# Patient Record
Sex: Female | Born: 1995 | Race: Black or African American | Hispanic: No | Marital: Single | Smoking: Never smoker
Health system: Southern US, Community
[De-identification: ages and names within clinical notes are randomized; demographics above are authoritative.]

## PROBLEM LIST (undated history)

## (undated) DIAGNOSIS — B009 Herpesviral infection, unspecified: Secondary | ICD-10-CM

## (undated) DIAGNOSIS — F329 Major depressive disorder, single episode, unspecified: Secondary | ICD-10-CM

## (undated) DIAGNOSIS — F419 Anxiety disorder, unspecified: Secondary | ICD-10-CM

## (undated) DIAGNOSIS — F32A Depression, unspecified: Secondary | ICD-10-CM

## (undated) DIAGNOSIS — K219 Gastro-esophageal reflux disease without esophagitis: Secondary | ICD-10-CM

## (undated) DIAGNOSIS — J45909 Unspecified asthma, uncomplicated: Secondary | ICD-10-CM

## (undated) DIAGNOSIS — G43909 Migraine, unspecified, not intractable, without status migrainosus: Secondary | ICD-10-CM

## (undated) DIAGNOSIS — U071 COVID-19: Secondary | ICD-10-CM

## (undated) DIAGNOSIS — R011 Cardiac murmur, unspecified: Secondary | ICD-10-CM

## (undated) HISTORY — PX: WISDOM TOOTH EXTRACTION: SHX21

## (undated) HISTORY — DX: Herpesviral infection, unspecified: B00.9

## (undated) HISTORY — DX: COVID-19: U07.1

## (undated) HISTORY — DX: Major depressive disorder, single episode, unspecified: F32.9

## (undated) HISTORY — DX: Migraine, unspecified, not intractable, without status migrainosus: G43.909

## (undated) HISTORY — PX: TONSILLECTOMY: SUR1361

## (undated) HISTORY — PX: LEG SURGERY: SHX1003

## (undated) HISTORY — DX: Anxiety disorder, unspecified: F41.9

## (undated) HISTORY — DX: Depression, unspecified: F32.A

---
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
## 2016-04-06 ENCOUNTER — Encounter: Payer: Self-pay | Admitting: *Deleted

## 2016-04-06 NOTE — Discharge Instructions (Signed)
T & A INSTRUCTION SHEET - [facility] °[facility] ° °CREIGHTON VAUGHT, MD °PAUL H. JUENGEL, MD  °P. SCOTT BENNETT °CHAPMAN MCQUEEN, MD ° °[address] [place], [place] [address] TEL. [phone] °[address] [place] [address] [phone] ° °INFORMATION SHEET FOR A TONSILLECTOMY AND ADENDOIDECTOMY ° °About Your Tonsils and Adenoids ° The tonsils and adenoids are normal body tissues that are part of our immune system.  They normally help to protect us against diseases that may enter our mouth and nose.  However, sometimes the tonsils and/or adenoids become too large and obstruct our breathing, especially at night. °  ° If either of these things happen it helps to remove the tonsils and adenoids in order to become healthier. The operation to remove the tonsils and adenoids is called a tonsillectomy and adenoidectomy. ° °The Location of Your Tonsils and Adenoids ° The tonsils are located in the back of the throat on both side and sit in a cradle of muscles. The adenoids are located in the roof of the mouth, behind the nose, and closely associated with the opening of the Eustachian tube to the ear. ° °Surgery on Tonsils and Adenoids ° A tonsillectomy and adenoidectomy is a short operation which takes about thirty minutes.  This includes being put to sleep and being awakened.  Tonsillectomies and adenoidectomies are performed at [facility] and may require observation period in the recovery room prior to going home. ° °Following the Operation for a Tonsillectomy ° A cautery machine is used to control bleeding.  Bleeding from a tonsillectomy and adenoidectomy is minimal and postoperatively the risk of bleeding is approximately four percent, although this rarely life threatening. ° ° ° °After your tonsillectomy and adenoidectomy post-op care at home: ° °1. Our patients are able to go home the same day.  You may be given prescriptions for pain  medications and antibiotics, if indicated. °2. It is extremely important to remember that fluid intake is of utmost importance after a tonsillectomy.  The amount that you drink must be maintained in the postoperative period.  A good indication of whether a child is getting enough fluid is whether his/her urine output is constant.  As long as children are urinating or wetting their diaper every 6 - 8 hours this is usually enough fluid intake.   °3. Although rare, this is a risk of some bleeding in the first ten days after surgery.  This is usually occurs between day five and nine postoperatively.  This risk of bleeding is approximately four percent.  If you or your child should have any bleeding you should remain calm and notify our office or go directly to the Emergency Room at  [facility] where they will contact us. Our doctors are available seven days a week for notification.  We recommend sitting up quietly in a chair, place an ice pack on the front of the neck and spitting out the blood gently until we are able to contact you.  Adults should gargle gently with ice water and this may help stop the bleeding.  If the bleeding does not stop after a short time, i.e. 10 to 15 minutes, or seems to be increasing again, please contact us or go to the hospital.   °4. It is common for the pain to be worse at 5 - 7 days postoperatively.  This occurs because the “scab” is peeling off and the mucous membrane (skin of   the throat) is growing back where the tonsils were.   °5. It is common for a low-grade fever, less than 102, during the first week after a tonsillectomy and adenoidectomy.  It is usually due to not drinking enough liquids, and we suggest your use liquid Tylenol or the pain medicine with Tylenol prescribed in order to keep your temperature below 102.  Please follow the directions on the back of the bottle. °6. Do not take aspirin or any products that contain aspirin such as Bufferin, Anacin,  Ecotrin, aspirin gum, Goodies, BC headache powders, etc., after a T&A because it can promote bleeding.  Please check with our office before administering any other medication that may been prescribed by other doctors during the two week post-operative period. °7. If you happen to look in the mirror or into your child’s mouth you will see white/gray patches on the back of the throat.  This is what a scab looks like in the mouth and is normal after having a T&A.  It will disappear once the tonsil area heals completely. However, it may cause a noticeable odor, and this too will disappear with time.     °8. You or your child may experience ear pain after having a T&A.  This is called referred pain and comes from the throat, but it is felt in the ears.  Ear pain is quite common and expected.  It will usually go away after ten days.  There is usually nothing wrong with the ears, and it is primarily due to the healing area stimulating the nerve to the ear that runs along the side of the throat.  Use either the prescribed pain medicine or Tylenol as needed.  °9. The throat tissues after a tonsillectomy are obviously sensitive.  Smoking around children who have had a tonsillectomy significantly increases the risk of bleeding.  DO NOT SMOKE!  ° °General Anesthesia, Adult, Care After °Refer to this sheet in the next few weeks. These instructions provide you with information on caring for yourself after your procedure. Your health care provider may also give you more specific instructions. Your treatment has been planned according to current medical practices, but problems sometimes occur. Call your health care provider if you have any problems or questions after your procedure. °WHAT TO EXPECT AFTER THE PROCEDURE °After the procedure, it is typical to experience: °· Sleepiness. °· Nausea and vomiting. °HOME CARE INSTRUCTIONS °· For the first 24 hours after general anesthesia: °¨ Have a responsible person with you. °¨ Do not  drive a car. If you are alone, do not take public transportation. °¨ Do not drink alcohol. °¨ Do not take medicine that has not been prescribed by your health care provider. °¨ Do not sign important papers or make important decisions. °¨ You may resume a normal diet and activities as directed by your health care provider. °· Change bandages (dressings) as directed. °· If you have questions or problems that seem related to general anesthesia, call the hospital and ask for the anesthetist or anesthesiologist on call. °SEEK MEDICAL CARE IF: °· You have nausea and vomiting that continue the day after anesthesia. °· You develop a rash. °SEEK IMMEDIATE MEDICAL CARE IF:  °· You have difficulty breathing. °· You have chest pain. °· You have any allergic problems. °  °This information is not intended to replace advice given to you by your health care provider. Make sure you discuss any questions you have with your health care provider. °  °Document   Released: 12/21/2000 Document Revised: 10/05/2014 Document Reviewed: 01/13/2012 °Elsevier Interactive Patient Education ©2016 [employer] ° °

## 2016-04-07 ENCOUNTER — Ambulatory Visit: Payer: Medicaid Other | Admitting: Anesthesiology

## 2016-04-07 ENCOUNTER — Encounter
Admission: RE | Disposition: A | Discharge status: Home / Self Care | Payer: Self-pay | Source: Ambulatory Visit | Attending: Otolaryngology

## 2016-04-07 ENCOUNTER — Ambulatory Visit
Admission: RE | Admit: 2016-04-07 | Discharge: 2016-04-07 | Disposition: A | Discharge status: Home / Self Care | Payer: Medicaid Other | Source: Ambulatory Visit | Attending: Otolaryngology | Admitting: Otolaryngology

## 2016-04-07 DIAGNOSIS — J3501 Chronic tonsillitis: Secondary | ICD-10-CM | POA: Insufficient documentation

## 2016-04-07 DIAGNOSIS — Z79899 Other long term (current) drug therapy: Secondary | ICD-10-CM | POA: Insufficient documentation

## 2016-04-07 DIAGNOSIS — J45909 Unspecified asthma, uncomplicated: Secondary | ICD-10-CM | POA: Insufficient documentation

## 2016-04-07 DIAGNOSIS — K219 Gastro-esophageal reflux disease without esophagitis: Secondary | ICD-10-CM | POA: Insufficient documentation

## 2016-04-07 DIAGNOSIS — Z8249 Family history of ischemic heart disease and other diseases of the circulatory system: Secondary | ICD-10-CM | POA: Diagnosis not present

## 2016-04-07 DIAGNOSIS — Z825 Family history of asthma and other chronic lower respiratory diseases: Secondary | ICD-10-CM | POA: Insufficient documentation

## 2016-04-07 HISTORY — PX: TONSILLECTOMY: SHX5217

## 2016-04-07 HISTORY — DX: Cardiac murmur, unspecified: R01.1

## 2016-04-07 HISTORY — DX: Gastro-esophageal reflux disease without esophagitis: K21.9

## 2016-04-07 HISTORY — DX: Unspecified asthma, uncomplicated: J45.909

## 2016-04-07 SURGERY — TONSILLECTOMY
Anesthesia: General | Site: Throat | Laterality: Bilateral | Wound class: Clean Contaminated

## 2016-04-07 MED ORDER — MEPERIDINE HCL 25 MG/ML IJ SOLN: 6.2500 mg | INTRAMUSCULAR | Status: DC | PRN

## 2016-04-07 MED ORDER — DEXAMETHASONE SODIUM PHOSPHATE 4 MG/ML IJ SOLN
INTRAMUSCULAR | Status: DC | PRN
  Administered 2016-04-07: 10 mg via INTRAVENOUS

## 2016-04-07 MED ORDER — FENTANYL CITRATE (PF) 100 MCG/2ML IJ SOLN
INTRAMUSCULAR | Status: DC | PRN
  Administered 2016-04-07: 100 ug via INTRAVENOUS
  Administered 2016-04-07 (×2): 25 ug via INTRAVENOUS

## 2016-04-07 MED ORDER — ACETAMINOPHEN 10 MG/ML IV SOLN
1000.0000 mg | Freq: Once | INTRAVENOUS | Status: AC
  Administered 2016-04-07: 1000 mg via INTRAVENOUS

## 2016-04-07 MED ORDER — ONDANSETRON HCL 4 MG/2ML IJ SOLN
INTRAMUSCULAR | Status: DC | PRN
  Administered 2016-04-07: 4 mg via INTRAVENOUS

## 2016-04-07 MED ORDER — MIDAZOLAM HCL 5 MG/5ML IJ SOLN
INTRAMUSCULAR | Status: DC | PRN
  Administered 2016-04-07: 2 mg via INTRAVENOUS

## 2016-04-07 MED ORDER — GLYCOPYRROLATE 0.2 MG/ML IJ SOLN
INTRAMUSCULAR | Status: DC | PRN
  Administered 2016-04-07: 0.1 mg via INTRAVENOUS

## 2016-04-07 MED ORDER — LIDOCAINE HCL (CARDIAC) 20 MG/ML IV SOLN
INTRAVENOUS | Status: DC | PRN
  Administered 2016-04-07: 50 mg via INTRAVENOUS

## 2016-04-07 MED ORDER — SCOPOLAMINE 1 MG/3DAYS TD PT72
1.0000 | MEDICATED_PATCH | TRANSDERMAL | Status: DC
  Administered 2016-04-07: 1.5 mg via TRANSDERMAL

## 2016-04-07 MED ORDER — HYDROCODONE-ACETAMINOPHEN 7.5-325 MG/15ML PO SOLN: ORAL | Status: DC

## 2016-04-07 MED ORDER — SUCCINYLCHOLINE CHLORIDE 20 MG/ML IJ SOLN
INTRAMUSCULAR | Status: DC | PRN
  Administered 2016-04-07: 80 mg via INTRAVENOUS

## 2016-04-07 MED ORDER — OXYCODONE HCL 5 MG/5ML PO SOLN
5.0000 mg | Freq: Once | ORAL | Status: AC | PRN
  Administered 2016-04-07: 5 mg via ORAL

## 2016-04-07 MED ORDER — PROPOFOL 10 MG/ML IV BOLUS
INTRAVENOUS | Status: DC | PRN
  Administered 2016-04-07: 150 mg via INTRAVENOUS

## 2016-04-07 MED ORDER — PROMETHAZINE HCL 25 MG/ML IJ SOLN
6.2500 mg | INTRAMUSCULAR | Status: DC | PRN
Start: 2016-04-07 — End: 2016-04-07

## 2016-04-07 MED ORDER — OXYCODONE HCL 5 MG PO TABS: 5.0000 mg | ORAL_TABLET | Freq: Once | ORAL | Status: AC | PRN

## 2016-04-07 MED ORDER — AMOXICILLIN 400 MG/5ML PO SUSR: ORAL | Status: DC

## 2016-04-07 MED ORDER — BUPIVACAINE-EPINEPHRINE 0.25% -1:200000 IJ SOLN
INTRAMUSCULAR | Status: DC | PRN
  Administered 2016-04-07: 2 mL

## 2016-04-07 MED ORDER — PREDNISOLONE SODIUM PHOSPHATE 15 MG/5ML PO SOLN: ORAL | Status: DC

## 2016-04-07 MED ORDER — LACTATED RINGERS IV SOLN
INTRAVENOUS | Status: DC
  Administered 2016-04-07: 08:00:00 via INTRAVENOUS

## 2016-04-07 MED ORDER — HYDROMORPHONE HCL 1 MG/ML IJ SOLN
0.2500 mg | INTRAMUSCULAR | Status: DC | PRN
  Administered 2016-04-07 (×2): 0.3 mg via INTRAVENOUS

## 2016-04-07 SURGICAL SUPPLY — 16 items
CANISTER SUCT 1200ML W/VALVE (MISCELLANEOUS) ×3 IMPLANT
CATH ROBINSON RED A/P 10FR (CATHETERS) ×3 IMPLANT
COAG SUCT 10F 3.5MM HAND CTRL (MISCELLANEOUS) ×3 IMPLANT
DECANTER SPIKE VIAL GLASS SM (MISCELLANEOUS) ×3 IMPLANT
ELECT CAUTERY BLADE TIP 2.5 (TIP) ×3
ELECTRODE CAUTERY BLDE TIP 2.5 (TIP) ×2 IMPLANT
GLOVE BIO SURGEON STRL SZ7.5 (GLOVE) ×3 IMPLANT
KIT ROOM TURNOVER OR (KITS) ×3 IMPLANT
NEEDLE HYPO 25GX1X1/2 BEV (NEEDLE) ×3 IMPLANT
NS IRRIG 500ML POUR BTL (IV SOLUTION) ×3 IMPLANT
PACK TONSIL/ADENOIDS (PACKS) ×3 IMPLANT
PAD GROUND ADULT SPLIT (MISCELLANEOUS) ×3 IMPLANT
PENCIL ELECTRO HAND CTR (MISCELLANEOUS) ×3 IMPLANT
SOL ANTI-FOG 6CC FOG-OUT (MISCELLANEOUS) ×2 IMPLANT
SOL FOG-OUT ANTI-FOG 6CC (MISCELLANEOUS) ×1
SYRINGE 10CC LL (SYRINGE) ×3 IMPLANT

## 2016-04-07 NOTE — Anesthesia Postprocedure Evaluation (Signed)
Anesthesia Post Note  Patient: Dana Beck  Procedure(s) Performed: Procedure(s) (LRB): TONSILLECTOMY (Bilateral)  Patient location during evaluation: PACU Anesthesia Type: General Level of consciousness: awake and alert and oriented Pain management: pain level controlled Vital Signs Assessment: post-procedure vital signs reviewed and stable Respiratory status: spontaneous breathing and nonlabored ventilation Cardiovascular status: stable Postop Assessment: no signs of nausea or vomiting and adequate PO intake Anesthetic complications: no    Harolyn RutherfordJoshua Anjolie Majer

## 2016-04-07 NOTE — Anesthesia Preprocedure Evaluation (Signed)
Anesthesia Evaluation  Patient identified by MRN, date of birth, ID band Patient awake    Reviewed: Allergy & Precautions, NPO status , Patient's Chart, lab work & pertinent test results  Airway Mallampati: I  TM Distance: >3 FB Neck ROM: Full    Dental no notable dental hx.    Pulmonary asthma ,    Pulmonary exam normal        Cardiovascular negative cardio ROS Normal cardiovascular exam     Neuro/Psych negative neurological ROS  negative psych ROS   GI/Hepatic Neg liver ROS, GERD  Controlled,  Endo/Other  negative endocrine ROS  Renal/GU negative Renal ROS     Musculoskeletal negative musculoskeletal ROS (+)   Abdominal   Peds  Hematology negative hematology ROS (+)   Anesthesia Other Findings   Reproductive/Obstetrics                             Anesthesia Physical Anesthesia Plan  ASA: II  Anesthesia Plan: General   Post-op Pain Management:    Induction: Intravenous  Airway Management Planned:   Additional Equipment:   Intra-op Plan:   Post-operative Plan:   Informed Consent: I have reviewed the patients History and Physical, chart, labs and discussed the procedure including the risks, benefits and alternatives for the proposed anesthesia with the patient or authorized representative who has indicated his/her understanding and acceptance.     Plan Discussed with: CRNA  Anesthesia Plan Comments:         Anesthesia Quick Evaluation

## 2016-04-07 NOTE — Op Note (Signed)
04/07/2016  9:06 AM    Dana Beck  [id number]   Pre-Op Diagnosis:  CHRONIC TONSILLITIS J35  Post-op Diagnosis: chronic tonsillitis  Procedure: Tonsillectomy  Surgeon:  Sandi MealyBennett, Chet Greenley S., MD  Anesthesia:  General endotracheal  EBL:  Less than 25 cc  Complications:  None  Findings: 2+ cryptic tonsils. No significant adenoid tissue  Procedure: The patient was taken to the Operating Room and placed in the supine position.  After induction of general endotracheal anesthesia, the table was turned 90 degrees and the patient was draped in the usual fashion  with the eyes protected.  A mouth gag was inserted into the oral cavity to open the mouth, and examination of the oropharynx showed the uvula was non-bifid. The palate was palpated, and there was no evidence of submucous cleft. Examination of the nasopharynx showed no obstructing adenoids. The right tonsil was grasped with an Allis clamp and resected from the tonsillar fossa in the usual fashion with the Bovie. The left tonsil was resected in the same fashion. The Bovie was used to obtain hemostasis. Each tonsillar fossa was then carefully injected with 0.25% marcaine with epinephrine, 1:200,000, avoiding intravascular injection. The nose and throat were irrigated and suctioned to remove any  blood clot. The mouth gag was  removed with no evidence of active bleeding.  The patient was then returned to the anesthesiologist for awakening, and was taken to the Recovery Room in stable condition.  Cultures:  None.  Specimens:  Tonsils.  Disposition:   PACU to home  Plan: Soft, bland diet and push fluids. Take pain medications and antibiotics as prescribed. No strenuous activity for 2 weeks. Follow-up in 3 weeks.  Sandi MealyBennett, [name] 04/07/2016 9:06 AM

## 2016-04-07 NOTE — Anesthesia Procedure Notes (Addendum)
Procedure Name: Intubation Date/Time: 04/07/2016 8:44 AM Performed by: Jimmy PicketAMYOT, Parker Sawatzky Pre-anesthesia Checklist: Patient identified, Emergency Drugs available, Suction available, Patient being monitored and Timeout performed Patient Re-evaluated:Patient Re-evaluated prior to inductionOxygen Delivery Method: Circle system utilized Preoxygenation: Pre-oxygenation with 100% oxygen Intubation Type: IV induction Ventilation: Mask ventilation without difficulty Laryngoscope Size: Miller and 3 Grade View: Grade I Tube type: Oral Rae Tube size: 7.0 mm Number of attempts: 2 Airway Equipment and Method: Stylet Placement Confirmation: ETT inserted through vocal cords under direct vision,  positive ETCO2 and breath sounds checked- equal and bilateral Tube secured with: Tape Dental Injury: Teeth and Oropharynx as per pre-operative assessment  Comments: Unable to pass ETT on first attempt. Stylet inserted and ETT passed without difficulty. Grade I view. Tolerated well by pt.

## 2016-04-07 NOTE — Transfer of Care (Signed)
Immediate Anesthesia Transfer of Care Note  Patient: Dana Beck  Procedure(s) Performed: Procedure(s): TONSILLECTOMY (Bilateral)  Patient Location: PACU  Anesthesia Type: General  Level of Consciousness: awake, alert  and patient cooperative  Airway and Oxygen Therapy: Patient Spontanous Breathing and Patient connected to supplemental oxygen  Post-op Assessment: Post-op Vital signs reviewed, Patient's Cardiovascular Status Stable, Respiratory Function Stable, Patent Airway and No signs of Nausea or vomiting  Post-op Vital Signs: Reviewed and stable  Complications: No apparent anesthesia complications

## 2016-04-07 NOTE — H&P (Signed)
History and physical reviewed and will be scanned in later. No change in medical status reported by the patient or family, appears stable for surgery. All questions regarding the procedure answered, and patient (or family if a child) expressed understanding of the procedure.  Cotton Beckley S @TODAY@ 

## 2016-04-08 ENCOUNTER — Encounter: Payer: Self-pay | Admitting: Otolaryngology

## 2016-04-09 LAB — SURGICAL PATHOLOGY

## 2016-08-24 ENCOUNTER — Ambulatory Visit (INDEPENDENT_AMBULATORY_CARE_PROVIDER_SITE_OTHER): Payer: Self-pay | Admitting: Orthopaedic Surgery

## 2016-08-25 ENCOUNTER — Ambulatory Visit (INDEPENDENT_AMBULATORY_CARE_PROVIDER_SITE_OTHER): Payer: Medicaid Other | Admitting: Orthopaedic Surgery

## 2016-08-25 ENCOUNTER — Encounter (INDEPENDENT_AMBULATORY_CARE_PROVIDER_SITE_OTHER): Payer: Self-pay | Admitting: Orthopaedic Surgery

## 2016-08-25 ENCOUNTER — Ambulatory Visit (INDEPENDENT_AMBULATORY_CARE_PROVIDER_SITE_OTHER): Payer: Medicaid Other

## 2016-08-25 ENCOUNTER — Other Ambulatory Visit (INDEPENDENT_AMBULATORY_CARE_PROVIDER_SITE_OTHER): Payer: Self-pay

## 2016-08-25 VITALS — Ht 64.0 in | Wt 130.0 lb

## 2016-08-25 DIAGNOSIS — M25561 Pain in right knee: Secondary | ICD-10-CM | POA: Diagnosis not present

## 2016-08-25 DIAGNOSIS — M25571 Pain in right ankle and joints of right foot: Secondary | ICD-10-CM

## 2016-08-25 NOTE — Progress Notes (Signed)
Office Visit Note   Patient: Dana Beck           Date of Birth: 08/13/1996           MRN: [id number] Visit Date: 08/25/2016              Requested by: Ailene RavelMaura L Hamrick, MD [address] [place], [place] [address] PCP: Lonie PeakNathan Conroy, PA-C   Assessment & Plan: Visit Diagnoses:  1. Acute pain of right knee   2. Pain in right ankle and joints of right foot     Plan: Impression is chronic right ankle pain and compensatory right knee pain. I recommend MRI of the right ankle to evaluate for OCD lesion given 8 months of continued pain. Follow-up after MRI.  Follow-Up Instructions: No Follow-up on file.   Orders:  Orders Placed This Encounter  Procedures  . XR KNEE 3 VIEW RIGHT  . XR Ankle Complete Right   No orders of the defined types were placed in this encounter.     Procedures: No procedures performed   Clinical Data: No additional findings.   Subjective: Chief Complaint  Patient presents with  . Right Knee - Pain  . Right Ankle - Pain    The patient is a 20 year old healthy female with right ankle pain since March of this year. She sprained her ankle and was treated with a cam walker in physical therapy back in March through the athletic training Department. She is a Horticulturist, commercialdancer. She is also having some pain of her right knee. That radiates down into the calf but now localizes to the knee. Pain in the ankle is mainly anterior and lateral and worse with activity and dancing. She denies any swelling. She has not taken any significant amount of NSAIDs. Denies any constitutional symptoms.    Review of Systems  Constitutional: Negative.   HENT: Negative.   Eyes: Negative.   Respiratory: Negative.   Cardiovascular: Negative.   Endocrine: Negative.   Musculoskeletal: Negative.   Neurological: Negative.   Hematological: Negative.   Psychiatric/Behavioral: Negative.   All other systems reviewed and are negative.    Objective: Vital Signs: Ht 5\' 4"  (1.626  m)   Wt 130 lb (59 kg)   BMI 22.31 kg/m   Physical Exam  Constitutional: She is oriented to person, place, and time. She appears well-developed and well-nourished.  HENT:  Head: Atraumatic.  Eyes: EOM are normal.  Neck: Neck supple.  Cardiovascular: Intact distal pulses.   Pulmonary/Chest: Effort normal.  Abdominal: Soft.  Neurological: She is alert and oriented to person, place, and time.  Skin: Skin is warm. Capillary refill takes less than 2 seconds.  Psychiatric: She has a normal mood and affect. Her behavior is normal. Judgment and thought content normal.  Nursing note and vitals reviewed.   Ortho Exam Exam of the right knee and ankle are fairly benign. I did not detect any effusion of the right knee. Her right knee exam is unremarkable. Exam of the right ankle shows no significant swelling. Stable anterior drawer test. Specialty Comments:  No specialty comments available.  Imaging: Xr Ankle Complete Right  Result Date: 08/25/2016 Negative for acute findings  Xr Knee 3 View Right  Result Date: 08/25/2016 Negative for acute findings    PMFS History: There are no active problems to display for this patient.  Past Medical History:  Diagnosis Date  . Asthma   . GERD (gastroesophageal reflux disease)    in past  . Heart murmur  as infant    No family history on file.  Past Surgical History:  Procedure Laterality Date  . TONSILLECTOMY Bilateral 04/07/2016   Procedure: TONSILLECTOMY;  Surgeon: Geanie LoganPaul Bennett, MD;  [facility];  Service: ENT;  Laterality: Bilateral;  . WISDOM TOOTH EXTRACTION     Social History   Occupational History  . Not on file.   Social History Main Topics  . Smoking status: Never Smoker  . Smokeless tobacco: Never Used  . Alcohol use No  . Drug use: Unknown  . Sexual activity: Not on file

## 2016-08-31 ENCOUNTER — Ambulatory Visit
Admission: RE | Admit: 2016-08-31 | Discharge: 2016-08-31 | Disposition: A | Discharge status: Home / Self Care | Payer: Medicaid Other | Source: Ambulatory Visit | Attending: Orthopaedic Surgery | Admitting: Orthopaedic Surgery

## 2016-08-31 DIAGNOSIS — M25571 Pain in right ankle and joints of right foot: Secondary | ICD-10-CM

## 2016-09-01 ENCOUNTER — Ambulatory Visit (INDEPENDENT_AMBULATORY_CARE_PROVIDER_SITE_OTHER): Payer: Medicaid Other | Admitting: Orthopaedic Surgery

## 2016-09-01 ENCOUNTER — Encounter (INDEPENDENT_AMBULATORY_CARE_PROVIDER_SITE_OTHER): Payer: Self-pay | Admitting: Orthopaedic Surgery

## 2016-09-01 DIAGNOSIS — M25571 Pain in right ankle and joints of right foot: Secondary | ICD-10-CM | POA: Diagnosis not present

## 2016-09-01 MED ORDER — PREDNISONE 10 MG (21) PO TBPK: 10.0000 mg | ORAL_TABLET | Freq: Every day | ORAL | 0 refills | Status: DC

## 2016-09-01 MED ORDER — DICLOFENAC SODIUM 75 MG PO TBEC: 75.0000 mg | DELAYED_RELEASE_TABLET | Freq: Two times a day (BID) | ORAL | 2 refills | Status: DC

## 2016-09-01 NOTE — Progress Notes (Signed)
   Office Visit Note   Patient: Dana Beck           Date of Birth: 10/08/1995           MRN: [id number] Visit Date: 09/01/2016              Requested by: Lonie PeakNathan Conroy, PA-C [address] [place], [place] [address] PCP: Lonie PeakNathan Conroy, PA-C   Assessment & Plan: Visit Diagnoses:  1. Pain in right ankle and joints of right foot     Plan: MRI reviewed shows bony contusion of medial talus. There are no OCD's. There is thickened ATFL. At this point I'm going to put her in a Cam Walker for 6 weeks and then wean as tolerated. Prednisone taper given. NSAIDs as needed. Follow-up as needed. Out of dance for 6 weeks.  Follow-Up Instructions: Return if symptoms worsen or fail to improve.   Orders:  No orders of the defined types were placed in this encounter.  Meds ordered this encounter  Medications  . predniSONE (STERAPRED UNI-PAK 21 TAB) 10 MG (21) TBPK tablet    Sig: Take 1 tablet (10 mg total) by mouth daily. Take as directed    Dispense:  21 tablet    Refill:  0  . diclofenac (VOLTAREN) 75 MG EC tablet    Sig: Take 1 tablet (75 mg total) by mouth 2 (two) times daily.    Dispense:  30 tablet    Refill:  2      Procedures: No procedures performed   Clinical Data: No additional findings.   Subjective: Chief Complaint  Patient presents with  . Right Ankle - Pain    Patient is back today for review of her right ankle MRI.  Denies any changes.    Review of Systems   Objective: Vital Signs: There were no vitals taken for this visit.  Physical Exam  Ortho Exam Exam of the right ankle is unchanged. Specialty Comments:  No specialty comments available.  Imaging: No results found.   PMFS History: Patient Active Problem List   Diagnosis Date Noted  . Pain in right ankle and joints of right foot 09/01/2016   Past Medical History:  Diagnosis Date  . Asthma   . GERD (gastroesophageal reflux disease)    in past  . Heart murmur    as infant    History  reviewed. No pertinent family history.  Past Surgical History:  Procedure Laterality Date  . TONSILLECTOMY Bilateral 04/07/2016   Procedure: TONSILLECTOMY;  Surgeon: Geanie LoganPaul Bennett, MD;  [facility];  Service: ENT;  Laterality: Bilateral;  . WISDOM TOOTH EXTRACTION     Social History   Occupational History  . Not on file.   Social History Main Topics  . Smoking status: Never Smoker  . Smokeless tobacco: Never Used  . Alcohol use No  . Drug use: Unknown  . Sexual activity: Not on file

## 2016-10-20 ENCOUNTER — Encounter (INDEPENDENT_AMBULATORY_CARE_PROVIDER_SITE_OTHER): Payer: Self-pay | Admitting: Orthopaedic Surgery

## 2016-10-20 ENCOUNTER — Ambulatory Visit (INDEPENDENT_AMBULATORY_CARE_PROVIDER_SITE_OTHER): Payer: Medicaid Other | Admitting: Orthopaedic Surgery

## 2016-10-20 VITALS — Ht 64.0 in | Wt 130.0 lb

## 2016-10-20 DIAGNOSIS — M25571 Pain in right ankle and joints of right foot: Secondary | ICD-10-CM

## 2016-10-20 NOTE — Progress Notes (Addendum)
   Office Visit Note   Patient: Charlyne Petrinssence Adamek           Date of Birth: 12/10/1995           MRN: [id number] Visit Date: 10/20/2016              Requested by: Lonie PeakNathan Conroy, PA-C [address] [place], [place] [address] PCP: Lonie PeakNathan Conroy, PA-C   Assessment & Plan: Visit Diagnoses:  1. Pain in right ankle and joints of right foot     Plan: I think the patient is probably overdoing it with her ankle. She has an essentially normal MRI of her ankle. I gave her an injection into her peroneal tendon sheath today under sterile conditions over this will give her some good relief. I also put her in physical therapy at home exercises. I'll see her back as needed. If not better can consider complete rest and mobilization and short leg cast.  Follow-Up Instructions: Return if symptoms worsen or fail to improve.   Orders:  Orders Placed This Encounter  Procedures  . Ambulatory referral to Physical Therapy   No orders of the defined types were placed in this encounter.     Procedures: Medium Joint Inj Date/Time: 10/20/2016 9:20 PM Performed by: Tarry KosXU, Rhanda Lemire M Authorized by: Tarry KosXU, Relda Agosto M   Indications:  Pain Location:  Ankle Site:  R ankle Prep: patient was prepped and draped in usual sterile fashion   Ultrasound Guided: No   Fluoroscopic Guidance: No       Clinical Data: No additional findings.   Subjective: Chief Complaint  Patient presents with  . Right Ankle - Pain    Patient follows up today for right ankle pain. Visit 10 used to have pain along the peroneal tendons just posterior to the lateral malleolus. The pain is worse with dancing. She continues to dance. She is had no significant relief with diclofenac or prednisone.    Review of Systems   Objective: Vital Signs: Ht 5\' 4"  (1.626 m)   Wt 130 lb (59 kg)   BMI 22.31 kg/m   Physical Exam  Ortho Exam Exam of the right ankle shows tenderness palpation along the peroneal tendons. Specialty Comments:  No  specialty comments available.  Imaging: No results found.   PMFS History: Patient Active Problem List   Diagnosis Date Noted  . Pain in right ankle and joints of right foot 09/01/2016   Past Medical History:  Diagnosis Date  . Asthma   . GERD (gastroesophageal reflux disease)    in past  . Heart murmur    as infant    History reviewed. No pertinent family history.  Past Surgical History:  Procedure Laterality Date  . TONSILLECTOMY Bilateral 04/07/2016   Procedure: TONSILLECTOMY;  Surgeon: Geanie LoganPaul Bennett, MD;  [facility];  Service: ENT;  Laterality: Bilateral;  . WISDOM TOOTH EXTRACTION     Social History   Occupational History  . Not on file.   Social History Main Topics  . Smoking status: Never Smoker  . Smokeless tobacco: Never Used  . Alcohol use No  . Drug use: Unknown  . Sexual activity: Not on file

## 2016-11-05 ENCOUNTER — Ambulatory Visit: Payer: Medicaid Other | Attending: Orthopaedic Surgery | Admitting: Physical Therapy

## 2016-11-05 DIAGNOSIS — R2681 Unsteadiness on feet: Secondary | ICD-10-CM | POA: Diagnosis present

## 2016-11-05 DIAGNOSIS — R2689 Other abnormalities of gait and mobility: Secondary | ICD-10-CM | POA: Diagnosis present

## 2016-11-05 DIAGNOSIS — M25571 Pain in right ankle and joints of right foot: Secondary | ICD-10-CM | POA: Insufficient documentation

## 2016-11-05 DIAGNOSIS — M6281 Muscle weakness (generalized): Secondary | ICD-10-CM | POA: Diagnosis present

## 2016-11-05 NOTE — Therapy (Signed)
[facility] [address] [place], [place], [address] Phone: [phone]   Fax:  [phone]  Physical Therapy Evaluation  Patient Details  Name: Dana Beck MRN: [id number] Date of Birth: 06/24/1996 Referring Provider: [name]  Encounter Date: 11/05/2016      PT End of Session - 11/05/16 1021    Visit Number 1   Number of Visits 17   Date for PT Re-Evaluation 12/31/16   PT Start Time 0931   PT Stop Time 1020   PT Time Calculation (min) 49 min   Activity Tolerance Patient tolerated treatment well   Behavior [facility] tasks assessed/performed      Past Medical History:  Diagnosis Date  . Asthma   . GERD (gastroesophageal reflux disease)    in past  . Heart murmur    as infant    Past Surgical History:  Procedure Laterality Date  . TONSILLECTOMY Bilateral 04/07/2016   Procedure: TONSILLECTOMY;  Surgeon: Geanie LoganPaul Bennett, MD;  [facility];  Service: ENT;  Laterality: Bilateral;  . WISDOM TOOTH EXTRACTION      There were no vitals filed for this visit.       Subjective Assessment - 11/05/16 0926    Subjective pt is a 21 y.o F with CC of R ankle pain that started March 2017 that occured hwne she was running up hill and twisted her ankle inward. Signifcant swelling after the event and when to hospital and was given curtches and brace.  [facility] and was given a boot. she is a dance major in school and the pain hasn't gotten any better, she thinks she may have returned to soon.  Pain seems to stay in the ankle, has a hx or achilles tendinits and plantar fasciitis.  pain stays in the back of the ankle goins medial and lateral.    Limitations Standing;Walking   How long can you sit comfortably? unlimited   How long can you stand comfortably? 5 min   How long can you walk comfortably? unlimited   Diagnostic tests x-ray   Patient Stated Goals to be  able ot peform dance specific tasks (plie), decrease pain, improve ankle mobility.   Currently in Pain? Yes   Pain Score 3    Pain Location Ankle   Pain Orientation Right;Medial;Lateral;Posterior   Pain Descriptors / Indicators Tightness;Aching;Sore   Pain Type Chronic pain   Pain Onset More than a month ago   Pain Frequency Constant   Aggravating Factors  dancing, plie,    Pain Relieving Factors icing, biofreeze/ tiger balm,             [facility] PT Assessment - 11/05/16 0001      Assessment   Medical Diagnosis R ankle pain   Referring Provider [name]   Onset Date/Surgical Date --  March 2017   Hand Dominance Right   Next MD Visit --  4 weeks   Prior Therapy yes  ankle [facility]     Precautions   Precautions None     Restrictions   Weight Bearing Restrictions No     Balance Screen   Has the patient fallen in the past 6 months No   Has the patient had a decrease in activity level because of a fear of falling?  No   Is the patient reluctant to leave their home because of a fear of falling?  No  Home Environment   Living Environment Private residence   Living Arrangements Non-relatives/Friends   Available Help at Discharge Available PRN/intermittently;Friend(s)   Type of Home Apartment   Home Access Elevator;Stairs to enter   Entrance Stairs-Number of Steps 36   Entrance Stairs-Rails Can reach both   Home Layout One level   Home Equipment Other (comment)  Tall cam boot     Prior Function   Level of Independence Independent;Independent with basic ADLs   Vocation Buyer, retail Dance major   Leisure writing, reading,      Cognition   Overall Cognitive Status Within Functional Limits for tasks assessed     Observation/Other Assessments   Focus on Therapeutic Outcomes (FOTO)  52% limited  predicted 36% limited     Posture/Postural Control   Posture/Postural Control Postural limitations   Postural Limitations Forward head     ROM / Strength    AROM / PROM / Strength AROM;PROM;Strength     AROM   AROM Assessment Site Ankle   Right/Left Ankle Right;Left   Right Ankle Dorsiflexion -8  from nuetral   Right Ankle Plantar Flexion 50  pain during motion   Right Ankle Inversion 18  medial malleolus pain   Right Ankle Eversion 10   Left Ankle Dorsiflexion 8   Left Ankle Plantar Flexion 60   Left Ankle Inversion 29   Left Ankle Eversion 12     PROM   PROM Assessment Site Ankle   Right/Left Ankle Right   Right Ankle Dorsiflexion 1   Right Ankle Plantar Flexion 60  mild pain during movement   Right Ankle Inversion 21  pain medially   Right Ankle Eversion 16     Strength   Overall Strength Comments poserior tibialis 3+/5 on R   Strength Assessment Site Ankle;Hip   Right/Left Hip Right;Left   Right Hip ABduction 3+/5   Left Hip ABduction 4-/5   Right/Left Ankle Right;Left   Right Ankle Dorsiflexion 4/5   Right Ankle Plantar Flexion 4+/5  lateral ankle pain   Right Ankle Inversion 4-/5  medial ankle pain   Right Ankle Eversion 4+/5   Left Ankle Dorsiflexion 5/5   Left Ankle Plantar Flexion 5/5   Left Ankle Inversion 4/5   Left Ankle Eversion 5/5     Palpation   Palpation comment achilles pain on the R, posterior to the medial malleoulus, and the anterior aspect of the medial malleolous, tenderness at theATFL, CLF and PTFL on the L, tenderness at the posterior tib on the R     Special Tests    Special Tests Ankle/Foot Special Tests   Ankle/Foot Special Tests  Talar Tilt Test;Anterior Drawer Test;Kleiger Test     Anterior Drawer Test   Findings Positive   Side  Right     Talar Tilt Test    Findings Postive   Side  Right     Kleiger Test   Findings Positive   Side Right     Ambulation/Gait   Ambulation/Gait Yes   Gait Pattern Trendelenburg;Antalgic  bil toe out gait with hyperpronation                             PT Short Term Goals - 11/05/16 1035      PT SHORT TERM GOAL #1    Title she will be I with inital HEP (12/03/2016)   Baseline has been doing some HEP from previous PT treatment   Time  4   Period Weeks   Status New     PT SHORT TERM GOAL #2   Title She will be able to verbalize and demo proper techniques to prevent and reduce pain and inflammation RICE and HEP (12/03/2016)   Baseline Ices at home inconsistently but does not elevate   Time 4   Period Weeks   Status New     PT SHORT TERM GOAL #3   Title she will increase R ankle AROM DF to >/= 5 degrees with </= 3/10 pain to reduce catching her foot with walking and funcitonal progression (12/03/2016)   Baseline AROM -8 degrees (from neutral)   Time 4   Period Weeks   Status New     PT SHORT TERM GOAL #4   Title she will increase R ankle DF, inversion and posterior tib strength to >/=4/5 in all planes with </= 3/10 pain for therapeutic progression (12/03/2016)   Baseline R ankle DF 4-/5, inversion 4-/5, posterior tibilais 3+/5   Time 4   Period Weeks   Status New           PT Long Term Goals - 11/05/16 1041      PT LONG TERM GOAL #1   Title She will be I with all HEP given as of last visit ( 12/31/2016)   Baseline only doing some previous HEP given from other PT treatment   Time 8   Period Weeks   Status New     PT LONG TERM GOAL #2   Title She will increase R ankle AROM DF to >/= 8 degrees and PF to >/= 60 degrees with </= 1/10 pain for a functional and efficient gait pattern (12/31/2016)   Baseline -8 degrees DF, PF 50 degrees   Time 8   Period Weeks   Status New     PT LONG TERM GOAL #3   Title She will increase R hip abduction strength to >/= 4/5 to decrease trendelberg gait and promote distal control/ stability at the ankle (12/31/2016)   Baseline hip abductor strength 3+/5 on the R   Time 8   Period Weeks   Status New     PT LONG TERM GOAL #4   Title She will be able to perform dance specific activities and classes with </= 1/10 pain and/ or no report of feeling unstable/  apprehension  that her ankle will roll to assist with personal goal of returning to dance (12/31/2016)   Baseline unable to [facility], or do full dance routine due to pain or feeling unstable   Time 8   Period Weeks   Status New     PT LONG TERM GOAL #5   Title she will increase or FOTO score to </= 36% limited to demonstrate improvement in function at discharge (12/31/2016)   Baseline initial FOTO 52% limited   Time 8   Period Weeks   Status New               Plan - 11/05/16 1026    Clinical Impression Statement Sherece presents to OPPT as a low complexity evaluation of CC of R ankle pain S/P rolling her ankle in march 2017. limited R ankle mobility especially with DF compared bil due to tightness and pain. weakness noted in the R ankl compared bil. tenderness over the achilles, posterior tib muscle/ tendon and lateral ankle ligaments. gait she demonstrates hyperpronation and lateral toe out gait bil. she would benefit from physical therapy to improve ankle  mobility and stability, increased strength, reduce pain and return her to PLOF by addressing the deficits listed.    Rehab Potential Good   PT Frequency 2x / week   PT Duration 8 weeks   PT Treatment/Interventions ADLs/Self Care Home Management;Electrical Stimulation;Iontophoresis 4mg /ml Dexamethasone;Cryotherapy;Moist Heat;Ultrasound;Gait training;Therapeutic activities;Therapeutic exercise;Dry needling;Splinting;Taping;Passive range of motion;Patient/family education;Balance training   PT Next Visit Plan assess/ reviewe HEP, arch tape if beneficial, ankle strengthening, manual over the achilles, calf stretching, hip strengthening   PT Home Exercise Plan sidelying hip abduction, 4-way ankle, calf stretching, seated heel slides.    Consulted and Agree with Plan of Care Patient      Patient will benefit from skilled therapeutic intervention in order to improve the following deficits and impairments:  Pain, Decreased strength, Decreased endurance,  Decreased activity tolerance, Decreased range of motion, Difficulty walking, Postural dysfunction, Improper body mechanics, Decreased balance  Visit Diagnosis: Pain in right ankle and joints of right foot - Plan: PT plan of care cert/re-cert  Unsteadiness on feet - Plan: PT plan of care cert/re-cert  Muscle weakness (generalized) - Plan: PT plan of care cert/re-cert  Other abnormalities of gait and mobility - Plan: PT plan of care cert/re-cert     Problem List Patient Active Problem List   Diagnosis Date Noted  . Pain in right ankle and joints of right foot 09/01/2016   Lulu Riding PT, DPT, LAT, ATC  11/05/16  10:53 AM      [facility] [address] [place], [place], [address] Phone: [phone]   Fax:  [phone]  Name: Breiona Couvillon MRN: [id number] Date of Birth: 1996/05/01

## 2016-11-16 ENCOUNTER — Ambulatory Visit: Payer: Medicaid Other | Admitting: Physical Therapy

## 2016-11-16 DIAGNOSIS — M25571 Pain in right ankle and joints of right foot: Secondary | ICD-10-CM

## 2016-11-16 DIAGNOSIS — R2681 Unsteadiness on feet: Secondary | ICD-10-CM

## 2016-11-16 DIAGNOSIS — R2689 Other abnormalities of gait and mobility: Secondary | ICD-10-CM

## 2016-11-16 DIAGNOSIS — M6281 Muscle weakness (generalized): Secondary | ICD-10-CM

## 2016-11-16 NOTE — Therapy (Signed)
[facility] [address] [place], [place], [address] Phone: [phone]   Fax:  [phone]  Physical Therapy Treatment  Patient Details  Name: Dana Beck MRN: [id number] Date of Birth: 1996-03-14 Referring Provider: Gershon Mussel MD  Encounter Date: 11/16/2016      PT End of Session - 11/16/16 1551    Visit Number 2   Number of Visits 17   Date for PT Re-Evaluation 12/31/16   PT Start Time 1506   PT Stop Time 1600   PT Time Calculation (min) 54 min   Activity Tolerance Patient tolerated treatment well   Behavior During Therapy [facility] for tasks assessed/performed      Past Medical History:  Diagnosis Date  . Asthma   . GERD (gastroesophageal reflux disease)    in past  . Heart murmur    as infant    Past Surgical History:  Procedure Laterality Date  . TONSILLECTOMY Bilateral 04/07/2016   Procedure: TONSILLECTOMY;  Surgeon: Geanie Logan, MD;  [facility];  Service: ENT;  Laterality: Bilateral;  . WISDOM TOOTH EXTRACTION      There were no vitals filed for this visit.      Subjective Assessment - 11/16/16 1510    Subjective "I have been good, but my calf has been really tight. I have been doing a lot with the stretching and bands. I have been wearing my ankle brace most of the time during dance classes."   Currently in Pain? Yes   Pain Score 4    Pain Location Ankle   Pain Orientation Right;Medial;Lateral;Posterior   Pain Descriptors / Indicators Aching;Sore   Aggravating Factors  dancing   Pain Relieving Factors resting                         [facility] Adult PT Treatment/Exercise - 11/16/16 0001      Knee/Hip Exercises: Stretches   Gastroc Stretch 2 reps;30 seconds  on slant board   Soleus Stretch 3 reps;30 seconds  contract/relax 10 s hold   Other Knee/Hip Stretches soleus stretch on slant board 2 x 30 s     Knee/Hip Exercises: Aerobic   Stationary Bike L4 x 6 min      Modalities   Modalities Cryotherapy     Cryotherapy   Number Minutes Cryotherapy 10 Minutes   Cryotherapy Location Ankle  R side   Type of Cryotherapy Ice pack  with ankle dorsiflexed using strap     Manual Therapy   Manual Therapy Joint mobilization;Soft tissue mobilization;Myofascial release   Joint Mobilization grade 5 long axis distraction at R ankle   Soft tissue mobilization IASTM over R posterior tib, achilles, soleus   Myofascial Release manual trigger point release at R soleus.          Trigger Point Dry Needling - 11/16/16 1518    Consent Given? Yes   Education Handout Provided Yes   Muscles Treated Lower Body --  posterior tib; increased muscle length, twitch response   Gastrocnemius Response --                PT Short Term Goals - 11/05/16 1035      PT SHORT TERM GOAL #1   Title she will be I with inital HEP (12/03/2016)   Baseline has been doing some HEP from previous PT treatment   Time 4   Period Weeks   Status New     PT SHORT TERM GOAL #  2   Title She will be able to verbalize and demo proper techniques to prevent and reduce pain and inflammation RICE and HEP (12/03/2016)   Baseline Ices at home inconsistently but does not elevate   Time 4   Period Weeks   Status New     PT SHORT TERM GOAL #3   Title she will increase R ankle AROM DF to >/= 5 degrees with </= 3/10 pain to reduce catching her foot with walking and funcitonal progression (12/03/2016)   Baseline AROM -8 degrees (from neutral)   Time 4   Period Weeks   Status New     PT SHORT TERM GOAL #4   Title she will increase R ankle DF, inversion and posterior tib strength to >/=4/5 in all planes with </= 3/10 pain for therapeutic progression (12/03/2016)   Baseline R ankle DF 4-/5, inversion 4-/5, posterior tibilais 3+/5   Time 4   Period Weeks   Status New           PT Long Term Goals - 11/05/16 1041      PT LONG TERM GOAL #1   Title She will be I with all HEP given as of last  visit ( 12/31/2016)   Baseline only doing some previous HEP given from other PT treatment   Time 8   Period Weeks   Status New     PT LONG TERM GOAL #2   Title She will increase R ankle AROM DF to >/= 8 degrees and PF to >/= 60 degrees with </= 1/10 pain for a functional and efficient gait pattern (12/31/2016)   Baseline -8 degrees DF, PF 50 degrees   Time 8   Period Weeks   Status New     PT LONG TERM GOAL #3   Title She will increase R hip abduction strength to >/= 4/5 to decrease trendelberg gait and promote distal control/ stability at the ankle (12/31/2016)   Baseline hip abductor strength 3+/5 on the R   Time 8   Period Weeks   Status New     PT LONG TERM GOAL #4   Title She will be able to perform dance specific activities and classes with </= 1/10 pain and/ or no report of feeling unstable/  apprehension that her ankle will roll to assist with personal goal of returning to dance (12/31/2016)   Baseline unable to [facility], or do full dance routine due to pain or feeling unstable   Time 8   Period Weeks   Status New     PT LONG TERM GOAL #5   Title she will increase or FOTO score to </= 36% limited to demonstrate improvement in function at discharge (12/31/2016)   Baseline initial FOTO 52% limited   Time 8   Period Weeks   Status New               Plan - 11/16/16 1552    Clinical Impression Statement pt arrived 6 min late. Elspeth reported increased pain today in her R calf and ankle after her dance class. Focused on manual and stretching to increase ROM at R ankle. After treatment she reported decrease in pain to a 3/10. Used ice pack post session.    PT Next Visit Plan update HEP PRN, assess response to DN, arch tape if beneficial, ankle strengthening, manual over the achilles and soleus, calf stretching, hip strengthening   PT Home Exercise Plan sidelying hip abduction, 4-way ankle, calf stretching, seated heel slides.  Consulted and Agree with Plan of Care Patient       Patient will benefit from skilled therapeutic intervention in order to improve the following deficits and impairments:  Pain, Decreased strength, Decreased endurance, Decreased activity tolerance, Decreased range of motion, Difficulty walking, Postural dysfunction, Improper body mechanics, Decreased balance  Visit Diagnosis: Pain in right ankle and joints of right foot  Unsteadiness on feet  Muscle weakness (generalized)  Other abnormalities of gait and mobility     Problem List Patient Active Problem List   Diagnosis Date Noted  . Pain in right ankle and joints of right foot 09/01/2016    Zada GirtHaley Kendrix Orman, SPT 11/16/2016, 4:00 PM  [facility] [address] [place], [place], [address] Phone: [phone]   Fax:  [phone]  Name: Charlyne Petrinssence Gasparro MRN: [id number] Date of Birth: 12/13/1995

## 2016-11-18 ENCOUNTER — Ambulatory Visit: Payer: Medicaid Other | Admitting: Physical Therapy

## 2016-11-18 DIAGNOSIS — R2681 Unsteadiness on feet: Secondary | ICD-10-CM

## 2016-11-18 DIAGNOSIS — M25571 Pain in right ankle and joints of right foot: Secondary | ICD-10-CM | POA: Diagnosis not present

## 2016-11-18 DIAGNOSIS — R2689 Other abnormalities of gait and mobility: Secondary | ICD-10-CM

## 2016-11-18 DIAGNOSIS — M6281 Muscle weakness (generalized): Secondary | ICD-10-CM

## 2016-11-18 NOTE — Therapy (Signed)
[facility] [address] [place], [place], [address] Phone: [phone]   Fax:  [phone]  Physical Therapy Treatment  Patient Details  Name: Dana Beck MRN: [id number] Date of Birth: 25-Feb-1996 Referring Provider: Gershon Mussel MD  Encounter Date: 11/18/2016      PT End of Session - 11/18/16 1721    Visit Number 3   Number of Visits 17   Date for PT Re-Evaluation 12/31/16   PT Start Time 1630   PT Stop Time 1724   PT Time Calculation (min) 54 min   Activity Tolerance Patient tolerated treatment well   Behavior During Therapy [facility] for tasks assessed/performed      Past Medical History:  Diagnosis Date  . Asthma   . GERD (gastroesophageal reflux disease)    in past  . Heart murmur    as infant    Past Surgical History:  Procedure Laterality Date  . TONSILLECTOMY Bilateral 04/07/2016   Procedure: TONSILLECTOMY;  Surgeon: Geanie Logan, MD;  Location: [facility];  Service: ENT;  Laterality: Bilateral;  . WISDOM TOOTH EXTRACTION      There were no vitals filed for this visit.      Subjective Assessment - 11/18/16 1632    Subjective "I have been feeling better since the needling. I have also been icing with elevation and that seems to have helped. My calves are still tight."   Currently in Pain? Yes   Pain Score 3    Pain Location Ankle   Pain Orientation Right;Medial;Lateral;Proximal   Pain Descriptors / Indicators Aching;Sore                         [facility] Adult PT Treatment/Exercise - 11/18/16 0001      Knee/Hip Exercises: Stretches   Gastroc Stretch 3 reps;30 seconds  on slant board   Soleus Stretch 3 reps;30 seconds  on slant board     Knee/Hip Exercises: Aerobic   Stationary Bike L4 x 6 min     Knee/Hip Exercises: Standing   Heel Raises Both;2 sets;15 reps  with inversion     Knee/Hip Exercises: Sidelying   Hip ABduction Strengthening;Both;2 sets;15 reps  cueing to  keep hips in line      Modalities   Modalities Moist Heat     Moist Heat Therapy   Number Minutes Moist Heat 10 Minutes   Moist Heat Location --  R calf     Manual Therapy   Soft tissue mobilization IASTM over R soleus           Trigger Point Dry Needling - 11/18/16 1635    Consent Given? Yes   Education Handout Provided --  given previously   Muscles Treated Lower Body Soleus   Soleus Response Twitch response elicited;Palpable increased muscle length  R side              PT Education - 11/18/16 1720    Education provided Yes   Education Details education on DN and continuing to do HEP   Person(s) Educated Patient   Methods Explanation;Verbal cues   Comprehension Verbalized understanding;Verbal cues required          PT Short Term Goals - 11/05/16 1035      PT SHORT TERM GOAL #1   Title she will be I with inital HEP (12/03/2016)   Baseline has been doing some HEP from previous PT treatment   Time 4   Period Weeks  Status New     PT SHORT TERM GOAL #2   Title She will be able to verbalize and demo proper techniques to prevent and reduce pain and inflammation RICE and HEP (12/03/2016)   Baseline Ices at home inconsistently but does not elevate   Time 4   Period Weeks   Status New     PT SHORT TERM GOAL #3   Title she will increase R ankle AROM DF to >/= 5 degrees with </= 3/10 pain to reduce catching her foot with walking and funcitonal progression (12/03/2016)   Baseline AROM -8 degrees (from neutral)   Time 4   Period Weeks   Status New     PT SHORT TERM GOAL #4   Title she will increase R ankle DF, inversion and posterior tib strength to >/=4/5 in all planes with </= 3/10 pain for therapeutic progression (12/03/2016)   Baseline R ankle DF 4-/5, inversion 4-/5, posterior tibilais 3+/5   Time 4   Period Weeks   Status New           PT Long Term Goals - 11/05/16 1041      PT LONG TERM GOAL #1   Title She will be I with all HEP given as of last  visit ( 12/31/2016)   Baseline only doing some previous HEP given from other PT treatment   Time 8   Period Weeks   Status New     PT LONG TERM GOAL #2   Title She will increase R ankle AROM DF to >/= 8 degrees and PF to >/= 60 degrees with </= 1/10 pain for a functional and efficient gait pattern (12/31/2016)   Baseline -8 degrees DF, PF 50 degrees   Time 8   Period Weeks   Status New     PT LONG TERM GOAL #3   Title She will increase R hip abduction strength to >/= 4/5 to decrease trendelberg gait and promote distal control/ stability at the ankle (12/31/2016)   Baseline hip abductor strength 3+/5 on the R   Time 8   Period Weeks   Status New     PT LONG TERM GOAL #4   Title She will be able to perform dance specific activities and classes with </= 1/10 pain and/ or no report of feeling unstable/  apprehension that her ankle will roll to assist with personal goal of returning to dance (12/31/2016)   Baseline unable to [facility], or do full dance routine due to pain or feeling unstable   Time 8   Period Weeks   Status New     PT LONG TERM GOAL #5   Title she will increase or FOTO score to </= 36% limited to demonstrate improvement in function at discharge (12/31/2016)   Baseline initial FOTO 52% limited   Time 8   Period Weeks   Status New               Plan - 11/18/16 1722    Clinical Impression Statement Dana Beck reported that she felt much better after the DN last session and would like to try it on her R soleus. DN was performed by a licensed PT. Focused on manual and and calf stretching to help calm down soreness. Continued modalities post session.   PT Next Visit Plan update HEP PRN, assess response to DN, arch tape if beneficial, ankle strengthening, manual over the achilles and soleus, calf stretching, hip strengthening   PT Home Exercise Plan sidelying hip  abduction, 4-way ankle, calf stretching, seated heel slides.    Consulted and Agree with Plan of Care Patient       Patient will benefit from skilled therapeutic intervention in order to improve the following deficits and impairments:  Pain, Decreased strength, Decreased endurance, Decreased activity tolerance, Decreased range of motion, Difficulty walking, Postural dysfunction, Improper body mechanics, Decreased balance  Visit Diagnosis: Pain in right ankle and joints of right foot  Unsteadiness on feet  Other abnormalities of gait and mobility  Muscle weakness (generalized)     Problem List Patient Active Problem List   Diagnosis Date Noted  . Pain in right ankle and joints of right foot 09/01/2016    Zada GirtHaley Pat Sires, SPT 11/18/2016, 5:29 PM  [facility] [address] [place], [place], [address] Phone: [phone]   Fax:  [phone]  Name: Dana Beck MRN: [id number] Date of Birth: 03/21/1996

## 2016-11-23 ENCOUNTER — Ambulatory Visit: Payer: Medicaid Other | Admitting: Physical Therapy

## 2016-11-23 DIAGNOSIS — R2689 Other abnormalities of gait and mobility: Secondary | ICD-10-CM

## 2016-11-23 DIAGNOSIS — M25571 Pain in right ankle and joints of right foot: Secondary | ICD-10-CM

## 2016-11-23 DIAGNOSIS — M6281 Muscle weakness (generalized): Secondary | ICD-10-CM

## 2016-11-23 DIAGNOSIS — R2681 Unsteadiness on feet: Secondary | ICD-10-CM

## 2016-11-23 NOTE — Therapy (Signed)
[facility] [address] [place], [place], [address] Phone: [phone]   Fax:  [phone]  Physical Therapy Treatment  Patient Details  Name: Dana Beck MRN: [id number] Date of Birth: 09/24/1996 Referring Provider: [name]  Encounter Date: 11/23/2016      PT End of Session - 11/23/16 1723    Visit Number 4   Number of Visits 17   Date for PT Re-Evaluation 12/31/16   PT Start Time 1631   PT Stop Time 1720   PT Time Calculation (min) 49 min   Activity Tolerance Patient tolerated treatment well   Behavior During Therapy [facility] for tasks assessed/performed      Past Medical History:  Diagnosis Date  . Asthma   . GERD (gastroesophageal reflux disease)    in past  . Heart murmur    as infant    Past Surgical History:  Procedure Laterality Date  . TONSILLECTOMY Bilateral 04/07/2016   Procedure: TONSILLECTOMY;  Surgeon: Geanie LoganPaul Bennett, MD;  [facility];  Service: ENT;  Laterality: Bilateral;  . WISDOM TOOTH EXTRACTION      There were no vitals filed for this visit.      Subjective Assessment - 11/23/16 1632    Subjective "The TPDN helped, but the soreness last for about 2 days"    Currently in Pain? Yes   Pain Score 2    Pain Orientation Right;Medial   Pain Descriptors / Indicators Aching;Sore   Pain Type Chronic pain   Pain Onset More than a month ago   Aggravating Factors  dancing, walking   Pain Relieving Factors resting                         [facility] Adult PT Treatment/Exercise - 11/23/16 0001      Knee/Hip Exercises: Stretches   Gastroc Stretch 2 reps;30 seconds   Soleus Stretch 2 reps;30 seconds     Modalities   Modalities Doctor, general practicelectrical Stimulation     Electrical Stimulation   Electrical Stimulation Location R calf   Electrical Stimulation Action IFC   Electrical Stimulation Parameters L 14 x 10 min, 100% scan, set at 10 PPS   Electrical Stimulation Goals  Other (comment)  tightness     Manual Therapy   Joint Mobilization grade 5 long axis distraction at R ankle   Soft tissue mobilization IASTM over R soleus/ gastroc, achilles tendon and peroneals   Myofascial Release Fascial stretching/ release over the R calf     Ankle Exercises: Seated   Other Seated Ankle Exercises 3-way ankle strengthening 1 x 15 each  avoiding PF                  PT Short Term Goals - 11/05/16 1035      PT SHORT TERM GOAL #1   Title she will be I with inital HEP (12/03/2016)   Baseline has been doing some HEP from previous PT treatment   Time 4   Period Weeks   Status New     PT SHORT TERM GOAL #2   Title She will be able to verbalize and demo proper techniques to prevent and reduce pain and inflammation RICE and HEP (12/03/2016)   Baseline Ices at home inconsistently but does not elevate   Time 4   Period Weeks   Status New     PT SHORT TERM GOAL #3   Title she will increase R ankle AROM DF to >/=  5 degrees with </= 3/10 pain to reduce catching her foot with walking and funcitonal progression (12/03/2016)   Baseline AROM -8 degrees (from neutral)   Time 4   Period Weeks   Status New     PT SHORT TERM GOAL #4   Title she will increase R ankle DF, inversion and posterior tib strength to >/=4/5 in all planes with </= 3/10 pain for therapeutic progression (12/03/2016)   Baseline R ankle DF 4-/5, inversion 4-/5, posterior tibilais 3+/5   Time 4   Period Weeks   Status New           PT Long Term Goals - 11/05/16 1041      PT LONG TERM GOAL #1   Title She will be I with all HEP given as of last visit ( 12/31/2016)   Baseline only doing some previous HEP given from other PT treatment   Time 8   Period Weeks   Status New     PT LONG TERM GOAL #2   Title She will increase R ankle AROM DF to >/= 8 degrees and PF to >/= 60 degrees with </= 1/10 pain for a functional and efficient gait pattern (12/31/2016)   Baseline -8 degrees DF, PF 50 degrees    Time 8   Period Weeks   Status New     PT LONG TERM GOAL #3   Title She will increase R hip abduction strength to >/= 4/5 to decrease trendelberg gait and promote distal control/ stability at the ankle (12/31/2016)   Baseline hip abductor strength 3+/5 on the R   Time 8   Period Weeks   Status New     PT LONG TERM GOAL #4   Title She will be able to perform dance specific activities and classes with </= 1/10 pain and/ or no report of feeling unstable/  apprehension that her ankle will roll to assist with personal goal of returning to dance (12/31/2016)   Baseline unable to [facility], or do full dance routine due to pain or feeling unstable   Time 8   Period Weeks   Status New     PT LONG TERM GOAL #5   Title she will increase or FOTO score to </= 36% limited to demonstrate improvement in function at discharge (12/31/2016)   Baseline initial FOTO 52% limited   Time 8   Period Weeks   Status New               Plan - 11/23/16 1723    Clinical Impression Statement [name] reports decreased pain/ tightness in the calf but conitnues to have soreness. she reported decreased tightness following soft tissue work and grade 5 ankle distraction mobs. She declined modalities post session.    PT Next Visit Plan update HEP PRN, assess response to DN, arch tape if beneficial, ankle strengthening, manual over the achilles and soleus, calf stretching, hip strengthening   Consulted and Agree with Plan of Care Patient      Patient will benefit from skilled therapeutic intervention in order to improve the following deficits and impairments:  Pain, Decreased strength, Decreased endurance, Decreased activity tolerance, Decreased range of motion, Difficulty walking, Postural dysfunction, Improper body mechanics, Decreased balance  Visit Diagnosis: Pain in right ankle and joints of right foot  Other abnormalities of gait and mobility  Unsteadiness on feet  Muscle weakness (generalized)     Problem  List Patient Active Problem List   Diagnosis Date Noted  . Pain  in right ankle and joints of right foot 09/01/2016   Lulu Riding PT, DPT, LAT, ATC  11/23/16  5:27 PM      [facility] [address] [place], [place], [address] Phone: [phone]   Fax:  [phone]  Name: Dana Beck MRN: [id number] Date of Birth: 11/26/1995

## 2016-11-25 ENCOUNTER — Ambulatory Visit: Payer: Medicaid Other | Admitting: Physical Therapy

## 2016-11-25 DIAGNOSIS — M25571 Pain in right ankle and joints of right foot: Secondary | ICD-10-CM

## 2016-11-25 DIAGNOSIS — M6281 Muscle weakness (generalized): Secondary | ICD-10-CM

## 2016-11-25 DIAGNOSIS — R2681 Unsteadiness on feet: Secondary | ICD-10-CM

## 2016-11-25 DIAGNOSIS — R2689 Other abnormalities of gait and mobility: Secondary | ICD-10-CM

## 2016-11-25 NOTE — Patient Instructions (Signed)
Hand written toe yoga 10-20 reps daily Lift great toe Lift other toes keeping great toe down,  Sitting and standing

## 2016-11-25 NOTE — Therapy (Signed)
[facility] [address] [place], [place], [address] Phone: [phone]   Fax:  [phone]  Physical Therapy Treatment  Patient Details  Name: Dana Beck MRN: [id number] Date of Birth: July 04, 1996 Referring Provider: Gershon Mussel MD  Encounter Date: 11/25/2016      PT End of Session - 11/25/16 1718    Visit Number 6   Number of Visits 17   Date for PT Re-Evaluation 12/31/16      Past Medical History:  Diagnosis Date  . Asthma   . GERD (gastroesophageal reflux disease)    in past  . Heart murmur    as infant    Past Surgical History:  Procedure Laterality Date  . TONSILLECTOMY Bilateral 04/07/2016   Procedure: TONSILLECTOMY;  Surgeon: Geanie Logan, MD;  Location[facility];  Service: ENT;  Laterality: Bilateral;  . WISDOM TOOTH EXTRACTION      There were no vitals filed for this visit.      Subjective Assessment - 11/25/16 1656    Subjective I had a little more pain last night ,  I danced without the brace.   Currently in Pain? Yes   Pain Score 2    Pain Location Ankle   Pain Orientation Right;Medial   Pain Descriptors / Indicators Aching;Sore   Pain Type Chronic pain   Pain Onset More than a month ago   Pain Frequency Constant   Aggravating Factors  dancing walking   Pain Relieving Factors resting.  sometimes nothing helps,  DN,  Brace                         [facility] Adult PT Treatment/Exercise - 11/25/16 0001      Knee/Hip Exercises: Stretches   Gastroc Stretch 2 reps;30 seconds     Cryotherapy   Number Minutes Cryotherapy --   Cryotherapy Location --   Type of Cryotherapy --     Vasopneumatic   Number Minutes Vasopneumatic  15 minutes   Vasopnuematic Location  --  foot   Vasopneumatic Pressure Medium   Vasopneumatic Temperature  32  elevated     Manual Therapy   Manual Therapy Passive ROM;Taping   Joint Mobilization Mobilization with movement with DF A/P glidas,   Fibular mobilization a/P glides  tender distal 1/3 (Soft tissue)   Passive ROM foot,, DF stretching   Kinesiotex Inhibit Muscle;Facilitate Muscle     Kinesiotix   Inhibit Muscle  gastroc/ coleus   Facilitate Muscle  Posterior tibialis     Ankle Exercises: Seated   Heel Slides 5 reps   Other Seated Ankle Exercises toe YOGA 2 poses sitting and standing 5 X each  difficult,  small arch forms with these.                 PT Education - 11/25/16 1710    Education provided Yes   Education Details anatomy education,  toe Yoga   Person(s) Educated Patient   Methods Explanation;Demonstration;Handout   Comprehension Verbalized understanding          PT Short Term Goals - 11/25/16 1713      PT SHORT TERM GOAL #1   Title she will be I with inital HEP (12/03/2016)   Time 4   Period Weeks   Status On-going     PT SHORT TERM GOAL #2   Title She will be able to verbalize and demo proper techniques to prevent and reduce pain and inflammation RICE and  HEP (12/03/2016)   Baseline still asks questions about pain control,  may be using ice more consistantly   Time 4   Period Weeks   Status On-going     PT SHORT TERM GOAL #3   Title she will increase R ankle AROM DF to >/= 5 degrees with </= 3/10 pain to reduce catching her foot with walking and funcitonal progression (12/03/2016)   Time 4   Period Weeks   Status Unable to assess     PT SHORT TERM GOAL #4   Title she will increase R ankle DF, inversion and posterior tib strength to >/=4/5 in all planes with </= 3/10 pain for therapeutic progression (12/03/2016)   Time 4   Period Weeks   Status Unable to assess           PT Long Term Goals - 11/05/16 1041      PT LONG TERM GOAL #1   Title She will be I with all HEP given as of last visit ( 12/31/2016)   Baseline only doing some previous HEP given from other PT treatment   Time 8   Period Weeks   Status New     PT LONG TERM GOAL #2   Title She will increase R ankle AROM DF to  >/= 8 degrees and PF to >/= 60 degrees with </= 1/10 pain for a functional and efficient gait pattern (12/31/2016)   Baseline -8 degrees DF, PF 50 degrees   Time 8   Period Weeks   Status New     PT LONG TERM GOAL #3   Title She will increase R hip abduction strength to >/= 4/5 to decrease trendelberg gait and promote distal control/ stability at the ankle (12/31/2016)   Baseline hip abductor strength 3+/5 on the R   Time 8   Period Weeks   Status New     PT LONG TERM GOAL #4   Title She will be able to perform dance specific activities and classes with </= 1/10 pain and/ or no report of feeling unstable/  apprehension that her ankle will roll to assist with personal goal of returning to dance (12/31/2016)   Baseline [facility] to [facility], or do full dance routine due to pain or feeling unstable   Time 8   Period Weeks   Status New     PT LONG TERM GOAL #5   Title she will increase or FOTO score to </= 36% limited to demonstrate improvement in function at discharge (12/31/2016)   Baseline initial FOTO 52% limited   Time 8   Period Weeks   Status New               Plan - 11/25/16 1711    Clinical Impression Statement DN helpful.  She was able to dance without her brace last night with increased pain as a result.  Manual focus of today's session.  Able to add Toe Yoga to HEP.  Cold helpful.   PT Next Visit Plan update HEP PRN,  assess Kinesiotex tape. Arch tape if beneficial, ankle strengthening, manual over the achilles and soleus, calf stretching, hip strengthening   PT Home Exercise Plan sidelying hip abduction, 4-way ankle, calf stretching, seated heel slides. Toe Yoga   Consulted and Agree with Plan of Care Patient      Patient will benefit from skilled therapeutic intervention in order to improve the following deficits and impairments:  Pain, Decreased strength, Decreased endurance, Decreased activity tolerance, Decreased range of  motion, Difficulty walking, Postural dysfunction,  Improper body mechanics, Decreased balance  Visit Diagnosis: Pain in right ankle and joints of right foot  Other abnormalities of gait and mobility  Unsteadiness on feet  Muscle weakness (generalized)     Problem List Patient Active Problem List   Diagnosis Date Noted  . Pain in right ankle and joints of right foot 09/01/2016    HARRIS,KAREN PTA 11/25/2016, 5:22 PM  [facility] [address] [place], [place], [address] Phone: [phone]   Fax:  [phone]  Name: Dana Beck MRN: [id number] Date of Birth: Feb 15, 1996

## 2016-11-30 ENCOUNTER — Ambulatory Visit: Payer: Medicaid Other | Admitting: Physical Therapy

## 2016-12-02 ENCOUNTER — Ambulatory Visit: Payer: Medicaid Other | Attending: Orthopaedic Surgery | Admitting: Physical Therapy

## 2016-12-02 DIAGNOSIS — M6281 Muscle weakness (generalized): Secondary | ICD-10-CM | POA: Diagnosis present

## 2016-12-02 DIAGNOSIS — R2681 Unsteadiness on feet: Secondary | ICD-10-CM

## 2016-12-02 DIAGNOSIS — M25571 Pain in right ankle and joints of right foot: Secondary | ICD-10-CM | POA: Diagnosis present

## 2016-12-02 DIAGNOSIS — R2689 Other abnormalities of gait and mobility: Secondary | ICD-10-CM

## 2016-12-02 NOTE — Therapy (Signed)
[facility] Outpatient Rehabilitation [facility] [address] [place], [place], [address] Phone: [phone]   Fax:  [phone]  Physical Therapy Treatment  Patient Details  Name: Dana Beck MRN: [id number] Date of Birth: 1996/02/10 Referring Provider: Gershon Mussel MD  Encounter Date: 12/02/2016      PT End of Session - 12/02/16 1721    Visit Number 7   Number of Visits 17   Date for PT Re-Evaluation 12/31/16   PT Start Time 1631   PT Stop Time 1730   PT Time Calculation (min) 59 min   Activity Tolerance Patient tolerated treatment well   Behavior During Therapy [facility] for tasks assessed/performed      Past Medical History:  Diagnosis Date  . Asthma   . GERD (gastroesophageal reflux disease)    in past  . Heart murmur    as infant    Past Surgical History:  Procedure Laterality Date  . TONSILLECTOMY Bilateral 04/07/2016   Procedure: TONSILLECTOMY;  Surgeon: Geanie Logan, MD;  Location: [facility];  Service: ENT;  Laterality: Bilateral;  . WISDOM TOOTH EXTRACTION      There were no vitals filed for this visit.      Subjective Assessment - 12/02/16 1645    Subjective "I am doing alittle better with my ankle today but haven't been as active"   Currently in Pain? Yes   Pain Score 3    Pain Location Ankle   Pain Orientation Right;Medial   Pain Descriptors / Indicators Aching;Sore   Pain Onset More than a month ago   Pain Frequency Constant   Aggravating Factors  dancing/ walking   Multiple Pain Sites Yes                         [facility] Adult PT Treatment/Exercise - 12/02/16 0001      Moist Heat Therapy   Number Minutes Moist Heat 10 Minutes   Moist Heat Location Ankle     Electrical Stimulation   Electrical Stimulation Location R posterior tib/ peroneal brevis/ longus   Electrical Stimulation Action E-stim for DN   Electrical Stimulation Parameters CRP at 15, intensity both channels at 1st solid dot   Electrical  Stimulation Goals Pain  muscle tightness     Manual Therapy   Joint Mobilization Mobilization with movement with DF A/P glidas,  Fibular mobilization a/P glides  tender distal 1/3 (Soft tissue)   Soft tissue mobilization IASTM over R distal peroneals and posterior tib/ flexor hallucis/ digitorum longus   Passive ROM foot,, DF stretching   Kinesiotex Inhibit Muscle;Facilitate Muscle     Kinesiotix   Inhibit Muscle  gastroc/ coleus   Facilitate Muscle  Posterior tibialis          Trigger Point Dry Needling - 12/02/16 1710    Consent Given? Yes   Education Handout Provided Yes  given previoulsy   Muscles Treated Lower Body --  peroneal longus/ brevis, posterior tib              PT Education - 12/02/16 1720    Education provided Yes   Education Details DN  with E-stim benefits   Person(s) Educated Patient   Methods Explanation;Verbal cues   Comprehension Verbalized understanding;Verbal cues required          PT Short Term Goals - 11/25/16 1713      PT SHORT TERM GOAL #1   Title she will be I with inital HEP (12/03/2016)   Time  4   Period Weeks   Status On-going     PT SHORT TERM GOAL #2   Title She will be able to verbalize and demo proper techniques to prevent and reduce pain and inflammation RICE and HEP (12/03/2016)   Baseline still asks questions about pain control,  may be using ice more consistantly   Time 4   Period Weeks   Status On-going     PT SHORT TERM GOAL #3   Title she will increase R ankle AROM DF to >/= 5 degrees with </= 3/10 pain to reduce catching her foot with walking and funcitonal progression (12/03/2016)   Time 4   Period Weeks   Status Unable to assess     PT SHORT TERM GOAL #4   Title she will increase R ankle DF, inversion and posterior tib strength to >/=4/5 in all planes with </= 3/10 pain for therapeutic progression (12/03/2016)   Time 4   Period Weeks   Status Unable to assess           PT Long Term Goals - 11/05/16 1041       PT LONG TERM GOAL #1   Title She will be I with all HEP given as of last visit ( 12/31/2016)   Baseline only doing some previous HEP given from other PT treatment   Time 8   Period Weeks   Status New     PT LONG TERM GOAL #2   Title She will increase R ankle AROM DF to >/= 8 degrees and PF to >/= 60 degrees with </= 1/10 pain for a functional and efficient gait pattern (12/31/2016)   Baseline -8 degrees DF, PF 50 degrees   Time 8   Period Weeks   Status New     PT LONG TERM GOAL #3   Title She will increase R hip abduction strength to >/= 4/5 to decrease trendelberg gait and promote distal control/ stability at the ankle (12/31/2016)   Baseline hip abductor strength 3+/5 on the R   Time 8   Period Weeks   Status New     PT LONG TERM GOAL #4   Title She will be able to perform dance specific activities and classes with </= 1/10 pain and/ or no report of feeling unstable/  apprehension that her ankle will roll to assist with personal goal of returning to dance (12/31/2016)   Baseline unable to [facility], or do full dance routine due to pain or feeling unstable   Time 8   Period Weeks   Status New     PT LONG TERM GOAL #5   Title she will increase or FOTO score to </= 36% limited to demonstrate improvement in function at discharge (12/31/2016)   Baseline initial FOTO 52% limited   Time 8   Period Weeks   Status New               Plan - 12/02/16 1721    Clinical Impression Statement pt reports relief of pain inthe calf btu soreness around bil malleoli. DN was performed on the R posterior tib and peroneal longus/ brevis combined with E-stim set at twitch to fatigute the muscle. following IASTM was performed and continued KT tpaing which she reported pain dropped post session. continued MHp to calm down soreness from DN.    PT Next Visit Plan assess respont to E-stim with DN, ,ankle strengthening, manual over the achilles and soleus, calf stretching, hip strengthening, did pt get  inserts?    Consulted and Agree with Plan of Care Patient      Patient will benefit from skilled therapeutic intervention in order to improve the following deficits and impairments:  Pain, Decreased strength, Decreased endurance, Decreased activity tolerance, Decreased range of motion, Difficulty walking, Postural dysfunction, Improper body mechanics, Decreased balance  Visit Diagnosis: Pain in right ankle and joints of right foot  Other abnormalities of gait and mobility  Unsteadiness on feet  Muscle weakness (generalized)     Problem List Patient Active Problem List   Diagnosis Date Noted  . Pain in right ankle and joints of right foot 09/01/2016   Lulu RidingKristoffer Ceirra Belli PT, DPT, LAT, ATC  12/02/16  5:24 PM      [facility] [address] [place], [place], [address] Phone: [phone]   Fax:  [phone]  Name: Dana Beck MRN: [id number] Date of Birth: 10/22/1995

## 2016-12-07 ENCOUNTER — Ambulatory Visit: Payer: Medicaid Other | Admitting: Physical Therapy

## 2016-12-09 ENCOUNTER — Encounter: Payer: Self-pay | Admitting: Physical Therapy

## 2016-12-09 ENCOUNTER — Ambulatory Visit: Payer: Medicaid Other | Admitting: Physical Therapy

## 2016-12-09 DIAGNOSIS — R2689 Other abnormalities of gait and mobility: Secondary | ICD-10-CM

## 2016-12-09 DIAGNOSIS — R2681 Unsteadiness on feet: Secondary | ICD-10-CM

## 2016-12-09 DIAGNOSIS — M6281 Muscle weakness (generalized): Secondary | ICD-10-CM

## 2016-12-09 DIAGNOSIS — M25571 Pain in right ankle and joints of right foot: Secondary | ICD-10-CM | POA: Diagnosis not present

## 2016-12-09 NOTE — Therapy (Addendum)
[facility] [address] [place], [place], [address] Phone: [phone]   Fax:  [phone]  Physical Therapy Treatment  Patient Details  Name: Dana Beck MRN: [id number] Date of Birth: 01/15/1996 Referring Provider: [name]  Encounter Date: 12/09/2016      PT End of Session - 12/09/16 1644    Visit Number 8   Number of Visits 17   Date for PT Re-Evaluation 12/31/16   PT Start Time 1550   PT Stop Time 1648   PT Time Calculation (min) 58 min   Activity Tolerance Patient tolerated treatment well   Behavior During Therapy [facility] for tasks assessed/performed      Past Medical History:  Diagnosis Date  . Asthma   . GERD (gastroesophageal reflux disease)    in past  . Heart murmur    as infant    Past Surgical History:  Procedure Laterality Date  . TONSILLECTOMY Bilateral 04/07/2016   Procedure: TONSILLECTOMY;  Surgeon: Geanie LoganPaul Bennett, MD;  [facility];  Service: ENT;  Laterality: Bilateral;  . WISDOM TOOTH EXTRACTION      There were no vitals filed for this visit.      Subjective Assessment - 12/09/16 1552    Subjective "The DN really helped, the pain is coming back"    Currently in Pain? Yes   Pain Score 3    Pain Location Ankle   Pain Orientation Right;Medial   Pain Descriptors / Indicators Aching;Sore   Pain Type Chronic pain                         [facility] Adult PT Treatment/Exercise - 12/09/16 0001      Knee/Hip Exercises: Sidelying   Hip ABduction 2 sets;15 reps;Both  4#     Moist Heat Therapy   Number Minutes Moist Heat 10 Minutes   Moist Heat Location Ankle     Electrical Stimulation   Electrical Stimulation Location R posterior tib/ peroneal brevis/ longus   Electrical Stimulation Action E-stim for DN   Electrical Stimulation Parameters CRP at 12 frequency, intensity bil channels at 1st solid dot x 10 min   Electrical Stimulation Goals Pain     Manual  Therapy   Manual therapy comments arch taping to lift and support medial aspect of the arch   Joint Mobilization Mobilization with movement with DF A/P glidas,  Fibular mobilization a/P glides  tender distal 1/3 (Soft tissue)   Soft tissue mobilization IASTM over R distal peroneals and posterior tib/ flexor hallucis/ digitorum longus   Passive ROM foot,, DF stretching          Trigger Point Dry Needling - 12/09/16 1608    Consent Given? Yes   Education Handout Provided Yes   Muscles Treated Lower Body --  posterior tib, peroneal longus brevis              PT Education - 12/09/16 1644    Education provided Yes   Education Details benefits of consistency with strengthening of the hip/ ankle. focusing on strengthening to fatigue to promote endurance and strength.    Person(s) Educated Patient   Methods Explanation;Verbal cues   Comprehension Verbalized understanding;Verbal cues required          PT Short Term Goals - 11/25/16 1713      PT SHORT TERM GOAL #1   Title she will be I with inital HEP (12/03/2016)   Time 4   Period Weeks  Status On-going     PT SHORT TERM GOAL #2   Title She will be able to verbalize and demo proper techniques to prevent and reduce pain and inflammation RICE and HEP (12/03/2016)   Baseline still asks questions about pain control,  may be using ice more consistantly   Time 4   Period Weeks   Status On-going     PT SHORT TERM GOAL #3   Title she will increase R ankle AROM DF to >/= 5 degrees with </= 3/10 pain to reduce catching her foot with walking and funcitonal progression (12/03/2016)   Time 4   Period Weeks   Status Unable to assess     PT SHORT TERM GOAL #4   Title she will increase R ankle DF, inversion and posterior tib strength to >/=4/5 in all planes with </= 3/10 pain for therapeutic progression (12/03/2016)   Time 4   Period Weeks   Status Unable to assess           PT Long Term Goals - 11/05/16 1041      PT LONG TERM  GOAL #1   Title She will be I with all HEP given as of last visit ( 12/31/2016)   Baseline only doing some previous HEP given from other PT treatment   Time 8   Period Weeks   Status New     PT LONG TERM GOAL #2   Title She will increase R ankle AROM DF to >/= 8 degrees and PF to >/= 60 degrees with </= 1/10 pain for a functional and efficient gait pattern (12/31/2016)   Baseline -8 degrees DF, PF 50 degrees   Time 8   Period Weeks   Status New     PT LONG TERM GOAL #3   Title She will increase R hip abduction strength to >/= 4/5 to decrease trendelberg gait and promote distal control/ stability at the ankle (12/31/2016)   Baseline hip abductor strength 3+/5 on the R   Time 8   Period Weeks   Status New     PT LONG TERM GOAL #4   Title She will be able to perform dance specific activities and classes with </= 1/10 pain and/ or no report of feeling unstable/  apprehension that her ankle will roll to assist with personal goal of returning to dance (12/31/2016)   Baseline unable to [facility], or do full dance routine due to pain or feeling unstable   Time 8   Period Weeks   Status New     PT LONG TERM GOAL #5   Title she will increase or FOTO score to </= 36% limited to demonstrate improvement in function at discharge (12/31/2016)   Baseline initial FOTO 52% limited   Time 8   Period Weeks   Status New               Plan - 12/09/16 1645    Clinical Impression Statement continued DN with E-stim for the posterior tib and peroneal longus/ brevis. following soft tissue work was performed to calm down tightnness. worked on proximal control with hip strengthening. discussed doing exercise to fatigue to promote endurance and strengthening.    PT Next Visit Plan assess respont to E-stim with DN, ,ankle strengthening, manual over the achilles and soleus, calf stretching, hip strengthening, did pt get inserts? Goals FOTO      Patient will benefit from skilled therapeutic intervention in order  to improve the following deficits and impairments:  Visit Diagnosis: Pain in right ankle and joints of right foot  Other abnormalities of gait and mobility  Unsteadiness on feet  Muscle weakness (generalized)     Problem List Patient Active Problem List   Diagnosis Date Noted  . Pain in right ankle and joints of right foot 09/01/2016   Lulu Riding PT, DPT, LAT, ATC  12/09/16  4:50 PM      [facility] [address] [place], [place], [address] Phone: [phone]   Fax:  [phone]  Name: Dana Beck MRN: [id number] Date of Birth: September 10, 1996

## 2016-12-15 ENCOUNTER — Ambulatory Visit: Payer: Medicaid Other | Admitting: Physical Therapy

## 2016-12-17 ENCOUNTER — Ambulatory Visit: Payer: Medicaid Other | Admitting: Physical Therapy

## 2016-12-22 ENCOUNTER — Ambulatory Visit: Payer: Medicaid Other | Admitting: Physical Therapy

## 2016-12-22 ENCOUNTER — Encounter: Payer: Self-pay | Admitting: Physical Therapy

## 2016-12-22 DIAGNOSIS — R2689 Other abnormalities of gait and mobility: Secondary | ICD-10-CM

## 2016-12-22 DIAGNOSIS — M25571 Pain in right ankle and joints of right foot: Secondary | ICD-10-CM | POA: Diagnosis not present

## 2016-12-22 DIAGNOSIS — M6281 Muscle weakness (generalized): Secondary | ICD-10-CM

## 2016-12-22 DIAGNOSIS — R2681 Unsteadiness on feet: Secondary | ICD-10-CM

## 2016-12-22 NOTE — Patient Instructions (Addendum)
Calf Stretch    Place one leg forward, bent, other leg behind and straight. Lean forward keeping back heel flat. Hold _20-30___ seconds while counting out loud. Repeat with other leg forward. Repeat __3__ times. Do _1___ sessions per day.  http://gt2.exer.us/478   Copyright  VHI. All rights reserved.  Calf Stretch (Soleus)    Gently bend knees slightly and move one foot back. Lean into wall so that stretch is felt in back lower calf. Hold _20-30___ seconds. Repeat with other leg back. Repeat ___3_ times. Do __1__ sessions per day.  http://gt2.exer.us/418   Copyright  VHI. All rights reserved.  JOSPT handout from exercise drawer. All issued 3-4 x a week 10 X each No bands needed yet.

## 2016-12-22 NOTE — Therapy (Signed)
[facility] Outpatient Rehabilitation [facility] [address] [place], [place], [address] Phone: [phone]   Fax:  [phone]  Physical Therapy Treatment  Patient Details  Name: Dana Beck MRN: [id number] Date of Birth: 29-Jan-1996 Referring Provider: Gershon Mussel MD  Encounter Date: 12/22/2016      PT End of Session - 12/22/16 1046    Visit Number 9   Number of Visits 17   Date for PT Re-Evaluation 12/31/16   PT Start Time 0850   PT Stop Time 0930   PT Time Calculation (min) 40 min   Behavior During Therapy [facility] for tasks assessed/performed      Past Medical History:  Diagnosis Date  . Asthma   . GERD (gastroesophageal reflux disease)    in past  . Heart murmur    as infant    Past Surgical History:  Procedure Laterality Date  . TONSILLECTOMY Bilateral 04/07/2016   Procedure: TONSILLECTOMY;  Surgeon: Geanie Logan, MD;  [facility];  Service: ENT;  Laterality: Bilateral;  . WISDOM TOOTH EXTRACTION      There were no vitals filed for this visit.      Subjective Assessment - 12/22/16 0857    Subjective Pain brief 4/-510 in left lateral ankle with stretched (PF)   DN with E-stim helped 3 days (Normal active days).  Feels bending exercises are a little easier .  pain is not improving.  Plantar fascitis is flared sore puffy.    Currently in Pain? Yes   Pain Score 4    Pain Location Ankle   Pain Orientation Right;Lateral   Pain Descriptors / Indicators Aching;Sore   Pain Type Chronic pain   Pain Frequency Constant   Aggravating Factors  Dancing/ walking   Pain Relieving Factors Ice.  DN.  sometimes noting helps.    Multiple Pain Sites Yes  left plantar fascitis,  Lateral ankle left 1 X left                         [facility] Adult PT Treatment/Exercise - 12/22/16 0001      Self-Care   Self-Care --  importance of HEP,  Foot support, ice,     Knee/Hip Exercises: Stretches   Gastroc Stretch 2 reps;20 seconds   HEP   Soleus Stretch 2 reps;20 seconds   Soleus Stretch Limitations HEP     Knee/Hip Exercises: Standing   Functional Squat 5 sets  of side steps 2 steps each,  No band,  HEP,  difficult     Knee/Hip Exercises: Supine   Single Leg Bridge 1 set;10 reps;Both  HEP,  difficult     Knee/Hip Exercises: Sidelying   Clams 10 X no bands top leg longer HEP  difficult.     Knee/Hip Exercises: Prone   Straight Leg Raises Limitations quadriped on elbows straight and bent knee hip strengthening 10 X each.  Monitored for technique  HEP,  Difficult     Ankle Exercises: Stretches   Plantar Fascia Stretch Limitations 3 minutes rolling ball under foot,  cued to move slowly, relax foot allowing bones to be pushed up,  and allowing soft tissue to soften and stretch.                PT Education - 12/22/16 1043    Education Details HEP   Person(s) Educated Patient   Methods Explanation;Demonstration;Verbal cues;Handout   Comprehension Verbalized understanding;Returned demonstration          PT Short  Term Goals - 12/22/16 1053      PT SHORT TERM GOAL #1   Title she will be I with inital HEP (12/03/2016)   Baseline Does some , needs reminders   Time 4   Period Weeks   Status On-going     PT SHORT TERM GOAL #2   Title She will be able to verbalize and demo proper techniques to prevent and reduce pain and inflammation RICE and HEP (12/03/2016)   Baseline she is not using ice   Time 4   Period Weeks   Status On-going     PT SHORT TERM GOAL #3   Title she will increase R ankle AROM DF to >/= 5 degrees with </= 3/10 pain to reduce catching her foot with walking and funcitonal progression (12/03/2016)   Time 4   Period Weeks   Status Unable to assess     PT SHORT TERM GOAL #4   Title she will increase R ankle DF, inversion and posterior tib strength to >/=4/5 in all planes with </= 3/10 pain for therapeutic progression (12/03/2016)   Time 4   Period Weeks   Status Unable to assess            PT Long Term Goals - 11/05/16 1041      PT LONG TERM GOAL #1   Title She will be I with all HEP given as of last visit ( 12/31/2016)   Baseline only doing some previous HEP given from other PT treatment   Time 8   Period Weeks   Status New     PT LONG TERM GOAL #2   Title She will increase R ankle AROM DF to >/= 8 degrees and PF to >/= 60 degrees with </= 1/10 pain for a functional and efficient gait pattern (12/31/2016)   Baseline -8 degrees DF, PF 50 degrees   Time 8   Period Weeks   Status New     PT LONG TERM GOAL #3   Title She will increase R hip abduction strength to >/= 4/5 to decrease trendelberg gait and promote distal control/ stability at the ankle (12/31/2016)   Baseline hip abductor strength 3+/5 on the R   Time 8   Period Weeks   Status New     PT LONG TERM GOAL #4   Title She will be able to perform dance specific activities and classes with </= 1/10 pain and/ or no report of feeling unstable/  apprehension that her ankle will roll to assist with personal goal of returning to dance (12/31/2016)   [facility], or do full dance routine due to pain or feeling unstable   Time 8   Period Weeks   Status New     PT LONG TERM GOAL #5   Title she will increase or FOTO score to </= 36% limited to demonstrate improvement in function at discharge (12/31/2016)   Baseline initial FOTO 52% limited   Time 8   Period Weeks   Status New               Plan - 12/22/16 1047    Clinical Impression Statement Patient is frustrated with progress.  She has beed non compliant with arch supports , use of ice and has not been doing all of her home exercises. She did have 4/10 pain post session today right.  She did not have left ankle pain during session.  Hip strengthening added to HEP.  Re issued calf  stretches.FOTO attempted, but could not find initial.   PT Next Visit Plan , ,ankle strengthening, manual over the achilles and soleus, calf stretching,review hip  strengthening, did pt get inserts? Goals FOTO(Check to see if done on intake)   PT Home Exercise Plan sidelying hip abduction, 4-way ankle, calf stretching, seated heel slides. Toe Yoga,  JOSPT hip strength,  Calf stretching(2)   Consulted and Agree with Plan of Care Patient      Patient will benefit from skilled therapeutic intervention in order to improve the following deficits and impairments:  Pain, Decreased strength, Decreased endurance, Decreased activity tolerance, Decreased range of motion, Difficulty walking, Postural dysfunction, Improper body mechanics, Decreased balance  Visit Diagnosis: Pain in right ankle and joints of right foot  Other abnormalities of gait and mobility  Unsteadiness on feet  Muscle weakness (generalized)     Problem List Patient Active Problem List   Diagnosis Date Noted  . Pain in right ankle and joints of right foot 09/01/2016    Dana Beck PTA 12/22/2016, 10:55 AM  [facility] [address] [place], [place], [address] Phone: [phone]   Fax:  [phone]  Name: Dana Beck MRN: [id number] Date of Birth: [birth year]

## 2016-12-24 ENCOUNTER — Ambulatory Visit: Payer: Medicaid Other | Admitting: Physical Therapy

## 2016-12-28 ENCOUNTER — Encounter: Payer: Self-pay | Admitting: Physical Therapy

## 2016-12-28 ENCOUNTER — Ambulatory Visit: Payer: Medicaid Other | Attending: Orthopaedic Surgery | Admitting: Physical Therapy

## 2016-12-28 DIAGNOSIS — M25571 Pain in right ankle and joints of right foot: Secondary | ICD-10-CM | POA: Insufficient documentation

## 2016-12-28 DIAGNOSIS — R2689 Other abnormalities of gait and mobility: Secondary | ICD-10-CM | POA: Insufficient documentation

## 2016-12-28 DIAGNOSIS — M6281 Muscle weakness (generalized): Secondary | ICD-10-CM | POA: Diagnosis present

## 2016-12-28 DIAGNOSIS — R2681 Unsteadiness on feet: Secondary | ICD-10-CM | POA: Insufficient documentation

## 2016-12-28 NOTE — Therapy (Signed)
[facility] Outpatient Rehabilitation [facility] [address] [place], [place], [address] Phone: [phone]   Fax:  [phone]  Physical Therapy Treatment / Re-certification  Patient Details  Name: Dana Beck MRN: [id number] Date of Birth: 1995/10/27 Referring Provider: Gershon Mussel MD  Encounter Date: 12/28/2016      PT End of Session - 12/28/16 1610    Visit Number 10   Number of Visits 17   Date for PT Re-Evaluation 02/22/17   PT Start Time 1545   PT Stop Time 1632   PT Time Calculation (min) 47 min   Activity Tolerance Patient tolerated treatment well   Behavior During Therapy [facility] for tasks assessed/performed      Past Medical History:  Diagnosis Date  . Asthma   . GERD (gastroesophageal reflux disease)    in past  . Heart murmur    as infant    Past Surgical History:  Procedure Laterality Date  . TONSILLECTOMY Bilateral 04/07/2016   Procedure: TONSILLECTOMY;  Surgeon: Geanie Logan, MD;  Location: [facility];  Service: ENT;  Laterality: Bilateral;  . WISDOM TOOTH EXTRACTION      There were no vitals filed for this visit.      Subjective Assessment - 12/28/16 1549    Subjective "I have sprained the L ankle, I saw the sports trainer and he said I have sprained my ankle some how" pt reports continued pain in both ankles.   Currently in Pain? Yes   Pain Score 3    Pain Location Ankle   Pain Orientation Right;Left   Pain Descriptors / Indicators Aching   Pain Type Chronic pain   Pain Onset More than a month ago   Pain Frequency Constant   Aggravating Factors  dancing, walking, stretching    Pain Relieving Factors ice, DN            [facility] PT Assessment - 12/28/16 0001      AROM   Right Ankle Dorsiflexion 6   Right Ankle Plantar Flexion 60   Right Ankle Inversion 18   Right Ankle Eversion 10   Left Ankle Dorsiflexion 8   Left Ankle Plantar Flexion 60   Left Ankle Inversion 18   Left Ankle Eversion 8     Strength   Right Hip ABduction 4-/5   Left Hip ABduction 4/5   Right/Left Ankle --  posterior tib 3+/5   Right Ankle Dorsiflexion 4-/5   Right Ankle Plantar Flexion 4+/5   Right Ankle Inversion 4-/5   Right Ankle Eversion 4+/5                     [facility] Adult PT Treatment/Exercise - 12/28/16 0001      Knee/Hip Exercises: Stretches   Gastroc Stretch 2 reps;30 seconds   Soleus Stretch 2 reps;30 seconds     Knee/Hip Exercises: Standing   Functional Squat 2 sets;10 reps   Other Standing Knee Exercises heel raises with inversion on slant board 2  sets to fatigue  verbal / tactile cues for form     Manual Therapy   Joint Mobilization Mobilization with movement with DF A/P glidas,  Fibular mobilization a/P glides       Ankle Exercises: Stretches   Other Stretch L fibularis tertius stretching 2 x 30 sec hold                PT Education - 12/28/16 1758    Education provided Yes   Education Details rationale as to  benefits of exercising muscles to fatigue, anatomy of muscles involved.   Person(s) Educated Patient   Methods Explanation;Verbal cues   Comprehension Verbalized understanding;Verbal cues required          PT Short Term Goals - 12/28/16 1803      PT SHORT TERM GOAL #1   Title she will be I with inital HEP (12/03/2016)   Baseline independent   Time 4   Period Weeks   Status Achieved     PT SHORT TERM GOAL #2   Title She will be able to verbalize and demo proper techniques to prevent and reduce pain and inflammation RICE and HEP (12/03/2016)   Time 4   Period Weeks   Status Achieved     PT SHORT TERM GOAL #3   Title she will increase R ankle AROM DF to >/= 5 degrees with </= 3/10 pain to reduce catching her foot with walking and funcitonal progression (12/03/2016)   Time 4   Period Weeks   Status Achieved     PT SHORT TERM GOAL #4   Title she will increase R ankle DF, inversion and posterior tib strength to >/=4/5 in all planes with </= 3/10 pain for  therapeutic progression (12/03/2016)   Baseline R ankle DF 4-/5, inversion 4-/5, posterior tibilais 3+/5   Time 4   Period Weeks   Status On-going           PT Long Term Goals - 12/28/16 1806      PT LONG TERM GOAL #1   Title She will be I with all HEP given as of last visit ( 12/31/2016)   Baseline independent with inital HEP   Time 8   Period Weeks   Status On-going     PT LONG TERM GOAL #2   Title She will increase R ankle AROM DF to >/= 8 degrees and PF to >/= 60 degrees with </= 1/10 pain for a functional and efficient gait pattern (12/31/2016)   Baseline 6 degrees DF, 60 degrees PF   Time 8   Period Weeks   Status New     PT LONG TERM GOAL #3   Title She will increase R hip abduction strength to >/= 4/5 to decrease trendelberg gait and promote distal control/ stability at the ankle (12/31/2016)   Baseline hip abductor strength 4-/5 on the R   Time 8   Period Weeks   Status On-going     PT LONG TERM GOAL #4   Title She will be able to perform dance specific activities and classes with </= 1/10 pain and/ or no report of feeling unstable/  apprehension that her ankle will roll to assist with personal goal of returning to dance (12/31/2016)   Baseline unable to [facility], or do full dance routine due to pain or feeling unstable   Time 8   Period Weeks   Status On-going     PT LONG TERM GOAL #5   Title she will increase or FOTO score to </= 36% limited to demonstrate improvement in function at discharge (12/31/2016)   Baseline FOTO 51% limited   Time 8   Period Weeks   Status On-going               Plan - 12/28/16 1759    Clinical Impression Statement pt continues to report pain inthe R ankle with recent exacerbation of pain inthe L ankle beginning within the last 2 weeks. L ankle pain seems to be related to  soreness around the talofibular ligament and peroneal tertius tendon. She demonstrate simprovement in ankle mobility with DF/PF.continued weakness with the ankle and  hips bil. focused on manual and strengthenig of the posterior tib to fatigue to promote arch support/ control. she is progressing with goals, plan to continue with current POC to work to ward remaining goals.    Rehab Potential Good   PT Frequency 2x / week   PT Duration 4 weeks   PT Treatment/Interventions ADLs/Self Care Home Management;Electrical Stimulation;Iontophoresis /ml Dexamethasone;Cryotherapy;Moist Heat;Ultrasound;Gait training;Therapeutic activities;Therapeutic exercise;Dry needling;Splinting;Taping;Passive range of motion;Patient/family education;Balance training   PT Next Visit Plan , ,ankle strengthening, manual over the achilles and soleus, calf stretching,review hip strengthening, did pt get inserts?    PT Home Exercise Plan sidelying hip abduction, 4-way ankle, calf stretching, seated heel slides. Toe Yoga,  JOSPT hip strength,  Calf stretching(2) to fatigue   Consulted and Agree with Plan of Care Patient      Patient will benefit from skilled therapeutic intervention in order to improve the following deficits and impairments:  Pain, Decreased strength, Decreased endurance, Decreased activity tolerance, Decreased range of motion, Difficulty walking, Postural dysfunction, Improper body mechanics, Decreased balance  Visit Diagnosis: Pain in right ankle and joints of right foot - Plan: PT plan of care cert/re-cert  Other abnormalities of gait and mobility - Plan: PT plan of care cert/re-cert  Unsteadiness on feet - Plan: PT plan of care cert/re-cert  Muscle weakness (generalized) - Plan: PT plan of care cert/re-cert     Problem List Patient Active Problem List   Diagnosis Date Noted  . Pain in right ankle and joints of right foot 09/01/2016   Lulu Riding PT, DPT, LAT, ATC  12/28/16  6:12 PM      [facility] Outpatient Rehabilitation [facility] [address] [place], [place], [address] Phone: [phone]   Fax:  [phone]  Name: Dana Beck MRN: [id number] Date of Birth: 1996-09-03

## 2016-12-29 ENCOUNTER — Ambulatory Visit: Payer: Medicaid Other | Admitting: Physical Therapy

## 2016-12-30 ENCOUNTER — Ambulatory Visit: Payer: Medicaid Other | Admitting: Physical Therapy

## 2016-12-31 ENCOUNTER — Encounter: Payer: Medicaid Other | Admitting: Physical Therapy

## 2017-01-04 ENCOUNTER — Ambulatory Visit: Payer: Medicaid Other | Admitting: Physical Therapy

## 2017-01-11 ENCOUNTER — Encounter: Payer: Self-pay | Admitting: Physical Therapy

## 2017-01-11 ENCOUNTER — Ambulatory Visit: Payer: Medicaid Other | Admitting: Physical Therapy

## 2017-01-11 DIAGNOSIS — M25571 Pain in right ankle and joints of right foot: Secondary | ICD-10-CM

## 2017-01-11 DIAGNOSIS — R2681 Unsteadiness on feet: Secondary | ICD-10-CM

## 2017-01-11 DIAGNOSIS — M6281 Muscle weakness (generalized): Secondary | ICD-10-CM

## 2017-01-11 DIAGNOSIS — R2689 Other abnormalities of gait and mobility: Secondary | ICD-10-CM

## 2017-01-11 NOTE — Therapy (Signed)
[facility] Outpatient Rehabilitation [facility] [address] [place], [place], [address] Phone: [phone]   Fax:  [phone]  Physical Therapy Treatment  Patient Details  Name: Dana Beck MRN: [id number] Date of Birth: [birth year] Referring Provider: Gershon Mussel MD  Encounter Date: 01/11/2017      PT End of Session - 01/11/17 1624    Visit Number 11   Number of Visits 17   Date for PT Re-Evaluation 02/22/17   PT Start Time 1505   PT Stop Time 1546   PT Time Calculation (min) 41 min   Activity Tolerance Patient tolerated treatment well   Behavior During Therapy [facility] for tasks assessed/performed      Past Medical History:  Diagnosis Date  . Asthma   . GERD (gastroesophageal reflux disease)    in past  . Heart murmur    as infant    Past Surgical History:  Procedure Laterality Date  . TONSILLECTOMY Bilateral 04/07/2016   Procedure: TONSILLECTOMY;  Surgeon: Geanie Logan, MD;  Location: [facility];  Service: ENT;  Laterality: Bilateral;  . WISDOM TOOTH EXTRACTION      There were no vitals filed for this visit.      Subjective Assessment - 01/11/17 1509    Subjective "still having pain in the R ankle. may be due to having to standing for long period"    Currently in Pain? Yes   Pain Score 4    Pain Orientation Right   Pain Descriptors / Indicators Aching   Pain Onset More than a month ago   Pain Frequency Constant   Aggravating Factors  standing/ walking, dancing.    Pain Relieving Factors icing, stretching.                          [facility] Adult PT Treatment/Exercise - 01/11/17 1534      Knee/Hip Exercises: Stretches   Gastroc Stretch 3 reps;30 seconds  using slant board     Modalities   Modalities Ultrasound     Ultrasound   Ultrasound Location R posterior tib  using biofreeze   Ultrasound Parameters 1.0 w/cm2 x 8 min @ 100%   Ultrasound Goals Pain     Manual Therapy   Manual Therapy Taping   Joint  Mobilization grade 5 R ankle distraction , grade 3 fibular    Soft tissue mobilization IASTM over R distal peroneals and posterior tib/ flexor hallucis/ digitorum longus   [place]ulligan postior fibular taping on the R     Ankle Exercises: Stretches   Slant Board Stretch 3 reps;30 seconds     Ankle Exercises: Seated   Other Seated Ankle Exercises 4-way theraband 2 x 10 focus on eccentric lowering  1 x 10 posterior tib strengthening, tactile cues for form                PT Education - 01/11/17 1624    Education provided Yes   Education Details reviewed previously provided HEP and to utilized controlled lowering   Person(s) Educated Patient   Methods Explanation;Verbal cues   Comprehension Verbalized understanding;Verbal cues required          PT Short Term Goals - 12/28/16 1803      PT SHORT TERM GOAL #1   Title she will be I with inital HEP (12/03/2016)   Baseline independent   Time 4   Period Weeks   Status Achieved     PT SHORT TERM GOAL #2   Title  She will be able to verbalize and demo proper techniques to prevent and reduce pain and inflammation RICE and HEP (12/03/2016)   Time 4   Period Weeks   Status Achieved     PT SHORT TERM GOAL #3   Title she will increase R ankle AROM DF to >/= 5 degrees with </= 3/10 pain to reduce catching her foot with walking and funcitonal progression (12/03/2016)   Time 4   Period Weeks   Status Achieved     PT SHORT TERM GOAL #4   Title she will increase R ankle DF, inversion and posterior tib strength to >/=4/5 in all planes with </= 3/10 pain for therapeutic progression (12/03/2016)   Baseline R ankle DF 4-/5, inversion 4-/5, posterior tibilais 3+/5   Time 4   Period Weeks   Status On-going           PT Long Term Goals - 12/28/16 1806      PT LONG TERM GOAL #1   Title She will be I with all HEP given as of last visit ( 12/31/2016)   Baseline independent with inital HEP   Time 8   Period Weeks   Status On-going     PT LONG  TERM GOAL #2   Title She will increase R ankle AROM DF to >/= 8 degrees and PF to >/= 60 degrees with </= 1/10 pain for a functional and efficient gait pattern (12/31/2016)   Baseline 6 degrees DF, 60 degrees PF   Time 8   Period Weeks   Status New     PT LONG TERM GOAL #3   Title She will increase R hip abduction strength to >/= 4/5 to decrease trendelberg gait and promote distal control/ stability at the ankle (12/31/2016)   Baseline hip abductor strength 4-/5 on the R   Time 8   Period Weeks   Status On-going     PT LONG TERM GOAL #4   Title She will be able to perform dance specific activities and classes with </= 1/10 pain and/ or no report of feeling unstable/  apprehension that her ankle will roll to assist with personal goal of returning to dance (12/31/2016)   Baseline unable to [facility], or do full dance routine due to pain or feeling unstable   Time 8   Period Weeks   Status On-going     PT LONG TERM GOAL #5   Title she will increase or FOTO score to </= 36% limited to demonstrate improvement in function at discharge (12/31/2016)   Baseline FOTO 51% limited   Time 8   Period Weeks   Status On-going               Plan - 01/11/17 1625    Clinical Impression Statement [place]onia reports minimal changes in pain since previous visit. Utilized [place] to calm down tightness and pain with bio-freeze, trialed taping to pull the distal fibula posterior and medial arch taping. post session she reported no pain.    PT Next Visit Plan , ,ankle strengthening, manual over the achilles and soleus, calf stretching,review hip strengthening, distal fibula tapling.    PT Home Exercise Plan sidelying hip abduction, 4-way ankle, calf stretching, seated heel slides. Toe Yoga,  JOSPT hip strength,  Calf stretching(2) to fatigue   Consulted and Agree with Plan of Care Patient      Patient will benefit from skilled therapeutic intervention in order to improve the following deficits and impairments:  Pain,  Decreased strength, Decreased endurance, Decreased activity tolerance, Decreased range of motion, Difficulty walking, Postural dysfunction, Improper body mechanics, Decreased balance  Visit Diagnosis: Pain in right ankle and joints of right foot  Other abnormalities of gait and mobility  Unsteadiness on feet  Muscle weakness (generalized)     Problem List Patient Active Problem List   Diagnosis Date Noted  . Pain in right ankle and joints of right foot 09/01/2016   Lulu Riding PT, DPT, LAT, ATC  01/11/17  4:30 PM      [facility] Outpatient Rehabilitation [facility] [address] [place], [place], [address] Phone: [phone]   Fax:  [phone]  Name: Marni Franzoni MRN: [id number] Date of Birth: September 05, 1996

## 2017-01-18 ENCOUNTER — Encounter: Payer: Self-pay | Admitting: Physical Therapy

## 2017-01-18 ENCOUNTER — Ambulatory Visit: Payer: Medicaid Other | Admitting: Physical Therapy

## 2017-01-18 DIAGNOSIS — M25571 Pain in right ankle and joints of right foot: Secondary | ICD-10-CM | POA: Diagnosis not present

## 2017-01-18 DIAGNOSIS — M6281 Muscle weakness (generalized): Secondary | ICD-10-CM

## 2017-01-18 DIAGNOSIS — R2689 Other abnormalities of gait and mobility: Secondary | ICD-10-CM

## 2017-01-18 DIAGNOSIS — R2681 Unsteadiness on feet: Secondary | ICD-10-CM

## 2017-01-18 NOTE — Therapy (Addendum)
[place], [place], [address] Phone: [phone]   Fax:  [phone]  Physical Therapy Treatment / Discharge Summary  Patient Details  Name: Dana Beck MRN: [id number] Date of Birth: 17-Mar-1996 Referring Provider: Frankey Shown MD  Encounter Date: 01/18/2017      PT End of Session - 01/18/17 1625    Visit Number 12   Number of Visits 17   Date for PT Re-Evaluation 02/22/17   PT Start Time 2703   PT Stop Time 1635   PT Time Calculation (min) 44 min   Activity Tolerance Patient tolerated treatment well   Behavior During Therapy [facility] for tasks assessed/performed      Past Medical History:  Diagnosis Date  . Asthma   . GERD (gastroesophageal reflux disease)    in past  . Heart murmur    as infant    Past Surgical History:  Procedure Laterality Date  . TONSILLECTOMY Bilateral 04/07/2016   Procedure: TONSILLECTOMY;  Surgeon: Clyde Canterbury, MD;  Location: [place];  Service: ENT;  Laterality: Bilateral;  . WISDOM TOOTH EXTRACTION      There were no vitals filed for this visit.      Subjective Assessment - 01/18/17 1553    Subjective Had 4 peformances had ankle taped. Pain 5/10.  Nothing seems to help.    Currently in Pain? Yes   Pain Score 5    Pain Location Ankle   Pain Orientation Right   Pain Descriptors / Indicators Aching   Pain Type Chronic pain   Pain Onset More than a month ago   Pain Frequency Constant   Aggravating Factors  dancing   Pain Relieving Factors ice stretching.,  tape.   Effect of Pain on Daily Activities Pain limits dancing , walking            [facility] PT Assessment - 01/18/17 0001      AROM   Right Ankle Dorsiflexion 10   Right Ankle Plantar Flexion 59   Right Ankle Inversion 30   Right Ankle Eversion 12   Left Ankle Dorsiflexion 10                     [facility] Adult PT Treatment/Exercise - 01/18/17 0001      Electrical Stimulation    Electrical Stimulation Location Posterior tib and other ankle   Electrical Stimulation Action IFC   Electrical Stimulation Parameters 12   Electrical Stimulation Goals Pain  concurrent with vasopneumatic     Vasopneumatic   Number Minutes Vasopneumatic  15 minutes   Vasopnuematic Location  Ankle   Vasopneumatic Pressure Medium   Vasopneumatic Temperature  32                  PT Short Term Goals - 01/18/17 1731      PT SHORT TERM GOAL #4   Title she will increase R ankle DF, inversion and posterior tib strength to >/=4/5 in all planes with </= 3/10 pain for therapeutic progression (12/03/2016)   Time 4   Period Weeks   Status Unable to assess           PT Long Term Goals - 01/18/17 1732      PT LONG TERM GOAL #1   Title She will be I with all HEP given as of last visit ( 12/31/2016)   Baseline independent with inital HEP   Time 8   Period Weeks   Status Achieved  PT LONG TERM GOAL #2   Baseline DF10, PF 59 AROM right ankle.  Pain 5/10   Time 8   Period Weeks   Status Partially Met     PT LONG TERM GOAL #3   Title She will increase R hip abduction strength to >/= 4/5 to decrease trendelberg gait and promote distal control/ stability at the ankle (12/31/2016)   Baseline 4/5   Time 8   Period Weeks   Status Achieved     PT LONG TERM GOAL #4   Title She will be able to perform dance specific activities and classes with </= 1/10 pain and/ or no report of feeling unstable/  apprehension that her ankle will roll to assist with personal goal of returning to dance (12/31/2016)   Baseline Pain 5/10 with danceing.   Unstable/ apprehension not assessed.   Time 8   Period Weeks   Status Not Met     PT LONG TERM GOAL #5   Title she will increase or FOTO score to </= 36% limited to demonstrate improvement in function at discharge (12/31/2016)   Baseline FOTO 53% limitation   Time 8   Period Weeks   Status Not Met               Plan - 01/18/17 1630     Clinical Impression Statement Patient has increased pain with increased dancing,  peformances (4) and daily practices.  With taping to dance her pain is a 5/10.  She is frustrated with lack of progress.  Hip strength has improved to 4/10 right.  AROM:  IV: 30,  EV: 12,  DF: 10,  PF: 59.  FOTO 47 % ability.  ( initial 48%, and 12/28/2016 49% ability) She feels she is ready for discharge.  She will follow up with her MD.   PT Next Visit Plan Discharge OK per PT Carlus Pavlov.   PT Home Exercise Plan sidelying hip abduction, 4-way ankle, calf stretching, seated heel slides. Toe Yoga,  JOSPT hip strength,  Calf stretching(2) to fatigue   Consulted and Agree with Plan of Care Patient      Patient will benefit from skilled therapeutic intervention in order to improve the following deficits and impairments:  Pain, Decreased strength, Decreased endurance, Decreased activity tolerance, Decreased range of motion, Difficulty walking, Postural dysfunction, Improper body mechanics, Decreased balance  Visit Diagnosis: Pain in right ankle and joints of right foot  Other abnormalities of gait and mobility  Unsteadiness on feet  Muscle weakness (generalized)     Problem List Patient Active Problem List   Diagnosis Date Noted  . Pain in right ankle and joints of right foot 09/01/2016    HARRIS,KAREN PTA 01/18/2017, 5:38 PM  [place], [place], [address] Phone: [phone]   Fax:  [phone]  Name: Dana Beck MRN: [id number] Date of Birth: 1995/11/17     PHYSICAL THERAPY DISCHARGE SUMMARY  Visits from Start of Care: 12  Current functional level related to goals / functional outcomes: See goals   Remaining deficits: Continued pain, collapsing arches, weakness in the ankle, limited balance   Education / Equipment: HEP, posture, theraband, benefits of inserts  Plan: Patient agrees to discharge.  Patient goals  were partially met. Patient is being discharged due to being pleased with the current functional level.  ?????       Kristoffer Leamon PT, DPT, LAT, ATC  01/19/17  7:46 AM

## 2017-01-20 ENCOUNTER — Ambulatory Visit: Payer: Medicaid Other | Admitting: Physical Therapy

## 2017-01-25 ENCOUNTER — Ambulatory Visit: Payer: Medicaid Other | Admitting: Physical Therapy

## 2018-10-06 ENCOUNTER — Ambulatory Visit (INDEPENDENT_AMBULATORY_CARE_PROVIDER_SITE_OTHER): Payer: BC Managed Care – PPO | Admitting: Obstetrics and Gynecology

## 2018-10-06 ENCOUNTER — Encounter: Payer: Self-pay | Admitting: Obstetrics and Gynecology

## 2018-10-06 ENCOUNTER — Other Ambulatory Visit (HOSPITAL_COMMUNITY)
Admission: RE | Admit: 2018-10-06 | Discharge: 2018-10-06 | Disposition: A | Discharge status: Home / Self Care | Payer: BC Managed Care – PPO | Source: Ambulatory Visit | Attending: Obstetrics and Gynecology | Admitting: Obstetrics and Gynecology

## 2018-10-06 VITALS — BP 126/70 | HR 104 | Ht 64.0 in | Wt 172.0 lb

## 2018-10-06 DIAGNOSIS — Z124 Encounter for screening for malignant neoplasm of cervix: Secondary | ICD-10-CM

## 2018-10-06 DIAGNOSIS — N921 Excessive and frequent menstruation with irregular cycle: Secondary | ICD-10-CM | POA: Diagnosis not present

## 2018-10-06 DIAGNOSIS — R102 Pelvic and perineal pain unspecified side: Secondary | ICD-10-CM

## 2018-10-06 DIAGNOSIS — R197 Diarrhea, unspecified: Secondary | ICD-10-CM

## 2018-10-06 DIAGNOSIS — Z113 Encounter for screening for infections with a predominantly sexual mode of transmission: Secondary | ICD-10-CM | POA: Diagnosis present

## 2018-10-06 DIAGNOSIS — Z3202 Encounter for pregnancy test, result negative: Secondary | ICD-10-CM | POA: Diagnosis not present

## 2018-10-06 LAB — POCT URINE PREGNANCY: PREG TEST UR: NEGATIVE

## 2018-10-06 NOTE — Progress Notes (Signed)
Dana Beck, [name], PA-C   Chief Complaint  Patient presents with  . 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 Dana Beck is a 23 y.o. No obstetric history on file. who LMP was Patient's last menstrual period was 09/28/2018 (exact date)., presents today for NP eval of dyspareunia, pelvic pain and BTB since last wk. Pt on OCPs for a couple yrs with monthly menses, lasting 3-4 days, no BTB, mild dysmen, improved with midol. Pt's 09/21/18 menses was normal but then bleeding started again 09/28/18 a few hrs after sex, and has continued since. Med to heavy flow and with clots, as well as a constant, severe pelvic "cramp", worse than menstrual cramps. Not relieved with NSAIDs. Had pain with sex that night before bleeding started, which is unusual for pt. Pt had missed OCP prior to bleeding. No vag or urin sx, except frequency with good flow. Has had diarrhea recently. No LBP, fevers.  Pt is sex active, no new partners. Never had pap   Past Medical History:  Diagnosis Date  . Asthma   . GERD (gastroesophageal reflux disease)    in past  . Heart murmur    as infant    Past Surgical History:  Procedure Laterality Date  . TONSILLECTOMY Bilateral 04/07/2016   Procedure: TONSILLECTOMY;  Surgeon: Geanie LoganPaul Bennett, MD;  [facility];  Service: ENT;  Laterality: Bilateral;  . WISDOM TOOTH EXTRACTION      History reviewed. No pertinent family history.  Social History   Socioeconomic History  . Marital status: Single    Spouse name: Not on file  . Number of children: Not on file  . Years of education: Not on file  . Highest education level: Not  on file  Occupational History  . Not on file  Social Needs  . Financial resource strain: Not on file  . Food insecurity:    Worry: Not on file    Inability: Not on file  . Transportation needs:    Medical: Not on file    Non-medical: Not on file  Tobacco Use  . Smoking status: Never Smoker  . Smokeless tobacco: Never Used  Substance and Sexual Activity  . Alcohol use: Yes    Comment: rarely  . Drug use: Never  . Sexual activity: Yes    Birth control/protection: Pill  Lifestyle  . Physical activity:    Days per week: Not on file    Minutes per session: Not on file  . Stress: Not on file  Relationships  . Social connections:    Talks on phone: Not on file    Gets together: Not on file    Attends religious service: Not on file    Active member of club or organization: Not on file    Attends meetings of clubs or organizations: Not on file    Relationship status: Not on file  . Intimate partner violence:    Fear of current or ex partner: Not on file    Emotionally abused: Not on file    Physically abused: Not on file  Forced sexual activity: Not on file  Other Topics Concern  . Not on file  Social History Narrative  . Not on file    Outpatient Medications Prior to Visit  Medication Sig Dispense Refill  . albuterol (PROVENTIL HFA;VENTOLIN HFA) 108 (90 Base) MCG/ACT inhaler Inhale into the lungs every 6 (six) hours as needed for wheezing or shortness of breath.    . levonorgestrel-ethinyl estradiol (LARISSIA) 0.1-20 MG-MCG tablet Take by mouth.    . valACYclovir (VALTREX) 1000 MG tablet Take by mouth.    . venlafaxine XR (EFFEXOR-XR) 75 MG 24 hr capsule Take by mouth.    . fluticasone (FLONASE) 50 MCG/ACT nasal spray Place into both nostrils daily as needed for allergies or rhinitis.    Marland Kitchen amoxicillin (AMOXIL) 400 MG/5ML suspension 2 1/4 teaspoon BID x 10 days 240 mL 0  . diclofenac (VOLTAREN) 75 MG EC tablet Take 1 tablet (75 mg total) by mouth 2 (two) times daily. 30  tablet 2  . HYDROcodone-acetaminophen (HYCET) 7.5-325 mg/15 ml solution 10-15 cc PO every 4-6 hours as needed for pain 300 mL 0  . levonorgestrel-ethinyl estradiol (AVIANE,ALESSE,LESSINA) 0.1-20 MG-MCG tablet Take 1 tablet by mouth daily.    . prednisoLONE (ORAPRED) 15 MG/5ML solution 5cc PO TID x 3 days, then 5 cc PO BID x 3 days, then 5cc PO QD x 3 days 100 mL 0  . predniSONE (STERAPRED UNI-PAK 21 TAB) 10 MG (21) TBPK tablet Take 1 tablet (10 mg total) by mouth daily. Take as directed 21 tablet 0   No facility-administered medications prior to visit.       ROS:  Review of Systems  Constitutional: Negative for fatigue, fever and unexpected weight change.  Respiratory: Negative for cough, shortness of breath and wheezing.   Cardiovascular: Negative for chest pain, palpitations and leg swelling.  Gastrointestinal: Positive for diarrhea. Negative for blood in stool, constipation, nausea and vomiting.  Endocrine: Negative for cold intolerance, heat intolerance and polyuria.  Genitourinary: Positive for dyspareunia, frequency, menstrual problem and pelvic pain. Negative for dysuria, flank pain, genital sores, hematuria, urgency, vaginal bleeding, vaginal discharge and vaginal pain.  Musculoskeletal: Negative for back pain, joint swelling and myalgias.  Skin: Negative for rash.  Neurological: Positive for dizziness and headaches. Negative for syncope, light-headedness and numbness.  Hematological: Negative for adenopathy.  Psychiatric/Behavioral: Positive for agitation and dysphoric mood. Negative for confusion, sleep disturbance and suicidal ideas. The patient is not nervous/anxious.    BREAST: No symptoms   OBJECTIVE:   Vitals:  BP 126/70   Pulse (!) 104   Ht 5\' 4"  (1.626 m)   Wt 172 lb (78 kg)   LMP 09/28/2018 (Exact Date)   BMI 29.52 kg/m   Physical Exam Vitals signs reviewed.  Constitutional:      Appearance: She is well-developed.  Pulmonary:     Effort: Pulmonary effort  is normal.  Abdominal:     Palpations: Abdomen is soft.     Tenderness: There is abdominal tenderness in the suprapubic area. There is no guarding or rebound.  Genitourinary:    Pubic Area: No rash.      Labia:        Right: No rash, tenderness or lesion.        Left: No rash, tenderness or lesion.      Vagina: Bleeding present. No vaginal discharge, erythema or tenderness.     Cervix: No cervical motion tenderness.     Uterus: Normal. Tender. Not enlarged.  Adnexa:        Right: Tenderness present. No mass.         Left: Tenderness present. No mass.    Musculoskeletal: Normal range of motion.  Neurological:     Mental Status: She is alert and oriented to person, place, and time.  Psychiatric:        Behavior: Behavior normal.        Thought Content: Thought content normal.     Results: Results for orders placed or performed in visit on 10/06/18 (from the past 24 hour(s))  POCT urine pregnancy     Status: Normal   Collection Time: 10/06/18  2:58 PM  Result Value Ref Range   Preg Test, Ur Negative Negative     Assessment/Plan: Pelvic pain - With BTB on OCPs. Tender on exam, not relieved with NSAIDs. Check GYN u/s, STD testing. Will f/u with results. If neg, question GI vs hormonal. - Plan: POCT urine pregnancy, US PELVIS TRANSVANGINAL NON-OB (TV ONLY)  Breakthrough bleeding on OCPs - After missed pill. Neg UPT. Check STD testing. If neg, reassurance and f/u next pill pack if persists - Plan: US PELVIS TRANSVANGINAL NON-OB (TV ONLY)  Cervical cancer screening - Plan: Cytology - PAP  Screening for STD (sexually transmitted disease) - Plan: Cytology - PAP  Diarrhea, unspecified type - Try immodium. See if helps with pelvic cramping too.     Return in about 1 day (around 10/07/2018) for GYN u/s for pelvic pain/BTB--ABC to call pt.  Emberly Tomasso B. Veryl Abril, PA-C 10/06/2018 3:22 PM

## 2018-10-06 NOTE — Patient Instructions (Signed)
I value your feedback and entrusting us with your care. If you get a [facility] patient survey, I would appreciate you taking the time to let us know about your experience today. Thank you! 

## 2018-10-07 ENCOUNTER — Ambulatory Visit (INDEPENDENT_AMBULATORY_CARE_PROVIDER_SITE_OTHER): Payer: BC Managed Care – PPO

## 2018-10-07 DIAGNOSIS — R102 Pelvic and perineal pain: Secondary | ICD-10-CM | POA: Diagnosis not present

## 2018-10-07 DIAGNOSIS — N921 Excessive and frequent menstruation with irregular cycle: Secondary | ICD-10-CM | POA: Diagnosis not present

## 2018-10-10 ENCOUNTER — Telehealth: Payer: Self-pay | Admitting: Obstetrics and Gynecology

## 2018-10-10 LAB — CYTOLOGY - PAP: Diagnosis: NEGATIVE

## 2018-10-10 NOTE — Telephone Encounter (Signed)
Pt aware of neg GYN results. Cramping/pain and BTB improving. Will f/u if STD testing abnormal. Otherwise, sx most likely related to missed pill and can follow expectantly. Pt agrees  ULTRASOUND REPORT  Location: [facility]  Date of Service: 10/07/2018    Indications:Pelvic Pain and breakthrough bleeding Findings:  The uterus is anteverted and measures 7.2 x 4.7 x 3.6cm. Echo texture is homogenous without evidence of focal masses.  The Endometrium measures 7.4 mm.  Right Ovary measures 4.1 x 4.1 x 2.2cm. Left Ovary measures 4.3 x 2.9 x 2.5cm.  Questionable appearance of PCOS. Survey of the adnexa demonstrates no adnexal masses. Small amount of fluid within the cervical canal.  Impression: 1. Questionable appearance of PCOS, otherwise normal gyn ultrasound.   Recommendations: 1.Clinical correlation with the patient's History and Physical Exam.   Darlina GuysAbby M Clarke, RDMS RVT

## 2018-11-07 ENCOUNTER — Ambulatory Visit: Payer: Self-pay | Admitting: Physician Assistant

## 2018-11-07 ENCOUNTER — Encounter: Payer: Self-pay | Admitting: Physician Assistant

## 2018-11-07 VITALS — BP 120/84 | HR 103 | Temp 98.5°F | Resp 16 | Ht 65.0 in | Wt 173.0 lb

## 2018-11-07 DIAGNOSIS — T3695XA Adverse effect of unspecified systemic antibiotic, initial encounter: Secondary | ICD-10-CM

## 2018-11-07 DIAGNOSIS — J029 Acute pharyngitis, unspecified: Secondary | ICD-10-CM

## 2018-11-07 DIAGNOSIS — J069 Acute upper respiratory infection, unspecified: Secondary | ICD-10-CM

## 2018-11-07 DIAGNOSIS — B379 Candidiasis, unspecified: Secondary | ICD-10-CM

## 2018-11-07 MED ORDER — OXYMETAZOLINE HCL 0.05 % NA SOLN: 1.0000 | Freq: Two times a day (BID) | NASAL | 0 refills | Status: AC

## 2018-11-07 MED ORDER — PREDNISONE 50 MG PO TABS: 50.0000 mg | ORAL_TABLET | Freq: Every day | ORAL | 0 refills | Status: AC

## 2018-11-07 MED ORDER — BENZONATATE 200 MG PO CAPS: 200.0000 mg | ORAL_CAPSULE | Freq: Three times a day (TID) | ORAL | 0 refills | Status: DC | PRN

## 2018-11-07 MED ORDER — FLUCONAZOLE 150 MG PO TABS
150.0000 mg | ORAL_TABLET | Freq: Every day | ORAL | 0 refills | Status: DC
Start: 2018-11-07 — End: 2018-11-30

## 2018-11-07 MED ORDER — AMOXICILLIN-POT CLAVULANATE 875-125 MG PO TABS: 1.0000 | ORAL_TABLET | Freq: Two times a day (BID) | ORAL | 0 refills | Status: AC

## 2018-11-07 NOTE — Progress Notes (Signed)
Patient ID: Dana Beck DOB: 03/29/1996 AGE: 23 y.o. MRN: [id number]   PCP: Dana Beck, [name], PA-C   Chief Complaint:  Chief Complaint  Patient presents with  . Sore Throat    x 1wk  . Cough    x1wk  . Nasal Congestion    x1wk     Subjective:    HPI:  Dana Beck is a 23 y.o. female presents for evaluation  Chief Complaint  Patient presents with  . Sore Throat    x 1wk  . Cough    x1wk  . Nasal Congestion    x541wk   23 year old female presents [facility] with 8 day history of URI symptoms. Began with sore throat. Irritating. Aggravated with swallowing. Has persisted; still has sore throat. Few days in to illness, developed hoarse voice and cough. Cough coarse and junky. Productive. Thick yellow nasal discharge and sputum. Occasionally blood streaked. Over past few days has developed nasal congestion/pressure. Has been using Flonase nasal spray, Robitussin, Delsym, Dayquil and Nyquil with minimal relief. Cough persistent at night, causing difficulty sleeping. Patient works as a Engineer, siteschool teacher. Multiple sick contacts. Patient did receive this season's influenza vaccination. Denies fever, chills, headache, ear pain, chest pain, SOB, wheezing. Patient with no smoking history.   Patient with history of frequent tonsillitis. Underwent tonsillectomy on 04/07/2016. Patient with asthma diagnosis. Has albuterol inhaler at home. Denies feeling current URI triggered asthma exacerbation. Denies chest tightness or wheezing. Has not been using albuterol inhaler.  A limited review of symptoms was performed, pertinent positives and negatives as mentioned in HPI.  The following portions of the patient's history were reviewed and updated as appropriate: allergies, current medications and past medical history.  Patient Active Problem List   Diagnosis Date Noted  . Pain in right ankle and joints of right foot 09/01/2016    No Known Allergies  Current Outpatient  Medications on File Prior to Visit  Medication Sig Dispense Refill  . albuterol (PROVENTIL HFA;VENTOLIN HFA) 108 (90 Base) MCG/ACT inhaler Inhale into the lungs every 6 (six) hours as needed for wheezing or shortness of breath.    . cyclobenzaprine (FLEXERIL) 5 MG tablet Take by mouth.    . fluticasone (FLONASE) 50 MCG/ACT nasal spray Place into both nostrils daily as needed for allergies or rhinitis.    Marland Kitchen. levonorgestrel-ethinyl estradiol (LARISSIA) 0.1-20 MG-MCG tablet Take by mouth.    . valACYclovir (VALTREX) 1000 MG tablet Take by mouth.    . venlafaxine XR (EFFEXOR-XR) 75 MG 24 hr capsule Take by mouth.    . meloxicam (MOBIC) 15 MG tablet Take 15 mg by mouth daily.     No current facility-administered medications on file prior to visit.        Objective:   Vitals:   11/07/18 1225  BP: 120/84  Pulse: (!) 103  Resp: 16  Temp: 98.5 F (36.9 C)  SpO2: 99%     Wt Readings from Last 3 Encounters:  11/07/18 173 lb (78.5 kg)  10/06/18 172 lb (78 kg)  10/20/16 130 lb (59 kg)    Physical Exam:   General Appearance:  Patient sitting comfortably on examination table. Conversational. Peri JeffersonGood self-historian. In no acute distress. Afebrile.   Head:  Normocephalic, without obvious abnormality, atraumatic  Eyes:  PERRL, conjunctiva/corneas clear, EOM's intact  Ears:  Bilateral ear canals WNL. No erythema or edema. No discharge/drainage. Bilateral TMs WNL. No erythema, injection, or serous effusion. No scar tissue.  Nose: Nares normal, septum midline. Nasal  mucosa with bilateral edema. Thick clear rhinorrhea. Nasally sounding voice. No visible dried blood or active epistaxis. No sinus tenderness with percussion/palpation.  Throat: Lips, mucosa, and tongue normal; teeth and gums normal. Throat reveals faint erythema. No exudate. Tonsils are surgically absent. Hoarse voice.  Neck: Supple, symmetrical, trachea midline, no adenopathy  Lungs:   Clear to auscultation bilaterally, respirations  unlabored. Good aeration. No rales, rhonchi, crackles or wheezing. No cough elicited with deep inspiration or forced expiration.  Heart:  Regular rate and rhythm (103bpm heart rate), S1 and S2 normal, no murmur, rub, or gallop  Extremities: Extremities normal, atraumatic, no cyanosis or edema  Pulses: 2+ and symmetric  Skin: Skin color, texture, turgor normal, no rashes or lesions  Lymph nodes: Cervical, supraclavicular, and axillary nodes normal  Neurologic: Normal    Assessment & Plan:    Exam findings, diagnosis etiology and medication use and indications reviewed with patient. Follow-Up and discharge instructions provided. No emergent/urgent issues found on exam.  Patient education was provided.   Patient verbalized understanding of information provided and agrees with plan of care (POC), all questions answered. The patient is advised to call or return to clinic if condition does not see an improvement in symptoms, or to seek the care of the closest emergency department if condition worsens with the below plan.    1. Upper respiratory tract infection, unspecified type - oxymetazoline (AFRIN NASAL SPRAY) 0.05 % nasal spray; Place 1 spray into both nostrils 2 (two) times daily for 3 days.  Dispense: 14.7 mL; Refill: 0 - benzonatate (TESSALON) 200 MG capsule; Take 1 capsule (200 mg total) by mouth 3 (three) times daily as needed for cough.  Dispense: 20 capsule; Refill: 0 - amoxicillin-clavulanate (AUGMENTIN) 875-125 MG tablet; Take 1 tablet by mouth 2 (two) times daily for 7 days.  Dispense: 14 tablet; Refill: 0  2. Pharyngitis, unspecified etiology - predniSONE (DELTASONE) 50 MG tablet; Take 1 tablet (50 mg total) by mouth daily with breakfast for 3 days.  Dispense: 3 tablet; Refill: 0  3. Antibiotic-induced yeast infection - fluconazole (DIFLUCAN) 150 MG tablet; Take 1 tablet (150 mg total) by mouth daily.  Dispense: 1 tablet; Refill: 0  Patient with 8 day history of URI symptoms.  VSS, afebrile, in no acute distress, clear lung sounds. Believe patient has uncomplicated viral URI. Underlying asthma; no recent exacerbation. Suspect bloody nasal discharge from Flonase. Advised discontinuation of steroid nasal spray. Prescribed Afrin nasal spray. Prescribed 3-day course of prednisone 50mg  qd for laryngitis; will also help with nasal congestion and cough. Due to duration of illness, prescribed wait and hold antibiotic (Augmentin) for patient to start if symptoms not improving in 3 days (will be 11 days of illness). Prescribed 150mg  tablet of Diflucan, in case of iatrogenic/antibiotic induced vaginal yeast infection. Advised patient f/u with PCP, urgent care, or [facility] in one week if symptoms not improving, sooner with any worsening symptoms. Patient agreed with plan. Gave patient work excuse note.   Janalyn Harder, MHS, PA-C Rulon Sera, MHS, PA-C Advanced Practice Provider [facility]  [address], [facility], [address]st Floor [place], [place] [address] (p):  [phone] [email or web address]

## 2018-11-07 NOTE — Patient Instructions (Addendum)
Thank you for choosing [facility] for your health care needs.  You have been diagnosed with an upper respiratory infection (a cold).  Recommend increase fluids. Drink hot tea with lemon/honey. Rest. Prop yourself up with several pillows to help with cough at night. May use cool mist humidifier in bedroom.  Take prescription medications as prescribed: Meds ordered this encounter  Medications  . oxymetazoline (AFRIN NASAL SPRAY) 0.05 % nasal spray    Sig: Place 1 spray into both nostrils 2 (two) times daily for 3 days.    Dispense:  14.7 mL    Refill:  0    Order Specific Question:   Supervising Provider    Answer:   MILLER, BRIAN [[id number]]  . predniSONE (DELTASONE) 50 MG tablet    Sig: Take 1 tablet (50 mg total) by mouth daily with breakfast for 3 days.    Dispense:  3 tablet    Refill:  0    Order Specific Question:   Supervising Provider    Answer:   MILLER, BRIAN [[id number]]  . benzonatate (TESSALON) 200 MG capsule    Sig: Take 1 capsule (200 mg total) by mouth 3 (three) times daily as needed for cough.    Dispense:  20 capsule    Refill:  0    Order Specific Question:   Supervising Provider    Answer:   MILLER, BRIAN [[id number]]  . amoxicillin-clavulanate (AUGMENTIN) 875-125 MG tablet    Sig: Take 1 tablet by mouth 2 (two) times daily for 7 days.    Dispense:  14 tablet    Refill:  0    Order Specific Question:   Supervising Provider    Answer:   MILLER, BRIAN [[id number]]  . fluconazole (DIFLUCAN) 150 MG tablet    Sig: Take 1 tablet (150 mg total) by mouth daily.    Dispense:  1 tablet    Refill:  0    Order Specific Question:   Supervising Provider    Answer:   MILLER, BRIAN [[id number]]   Can only use Afrin nasal spray for 3 days. Take prednisone with food to prevent stomach upset. Take in the morning, will reduce side effect of insomnia.  Start antibiotic, Augmentin, in three days if symptoms not improving. Take with food. If you start Augmentin course, take all 7 days of  antibiotic. If you develop vaginal yeast infection, may use Diflucan.  Follow-up with family physician, urgent care, or [facility] in one week if symptoms not improving. Sooner with any worsening symptoms.  Hope you feel better soon!  Upper Respiratory Infection, Adult An upper respiratory infection (URI) affects the nose, throat, and upper air passages. URIs are caused by germs (viruses). The most common type of URI is often called "the common cold." Medicines cannot cure URIs, but you can do things at home to relieve your symptoms. URIs usually get better within 7-10 days. Follow these instructions at home: Activity  Rest as needed.  If you have a fever, stay home from work or school until your fever is gone, or until your doctor says you may return to work or school. ? You should stay home until you cannot spread the infection anymore (you are not contagious). ? Your doctor may have you wear a face mask so you have less risk of spreading the infection. Relieving symptoms  Gargle with a salt-water mixture 3-4 times a day or as needed. To make a salt-water mixture, completely dissolve -1 tsp of salt in 1 cup of warm water.  Use a cool-mist humidifier to add moisture to the air. This can help you breathe more easily. Eating and drinking   Drink enough fluid to keep your pee (urine) pale yellow.  Eat soups and other clear broths. General instructions   Take over-the-counter and prescription medicines only as told by your doctor. These include cold medicines, fever reducers, and cough suppressants.  Do not use any products that contain nicotine or tobacco. These include cigarettes and e-cigarettes. If you need help quitting, ask your doctor.  Avoid being where people are smoking (avoid secondhand smoke).  Make sure you get regular shots and get the flu shot every year.  Keep all follow-up visits as told by your doctor. This is important. How to avoid spreading infection to  others   Wash your hands often with soap and water. If you do not have soap and water, use hand sanitizer.  Avoid touching your mouth, face, eyes, or nose.  Cough or sneeze into a tissue or your sleeve or elbow. Do not cough or sneeze into your hand or into the air. Contact a doctor if:  You are getting worse, not better.  You have any of these: ? A fever. ? Chills. ? Brown or red mucus in your nose. ? Yellow or brown fluid (discharge)coming from your nose. ? Pain in your face, especially when you bend forward. ? Swollen neck glands. ? Pain with swallowing. ? White areas in the back of your throat. Get help right away if:  You have shortness of breath that gets worse.  You have very bad or constant: ? Headache. ? Ear pain. ? Pain in your forehead, behind your eyes, and over your cheekbones (sinus pain). ? Chest pain.  You have long-lasting (chronic) lung disease along with any of these: ? Wheezing. ? Long-lasting cough. ? Coughing up blood. ? A change in your usual mucus.  You have a stiff neck.  You have changes in your: ? Vision. ? Hearing. ? Thinking. ? Mood. Summary  An upper respiratory infection (URI) is caused by a germ called a virus. The most common type of URI is often called "the common cold."  URIs usually get better within 7-10 days.  Take over-the-counter and prescription medicines only as told by your doctor. This information is not intended to replace advice given to you by your health care provider. Make sure you discuss any questions you have with your health care provider. Document Released: 03/02/2008 Document Revised: 05/07/2017 Document Reviewed: 05/07/2017 Elsevier Interactive Patient Education  2019 [employer]

## 2018-11-10 ENCOUNTER — Telehealth: Payer: Self-pay | Admitting: Emergency Medicine

## 2018-11-10 NOTE — Telephone Encounter (Signed)
Attempted to contact patient no answer unable to leave message. Following up on visit with [facility].

## 2018-11-30 ENCOUNTER — Encounter: Payer: Self-pay | Admitting: Internal Medicine

## 2018-11-30 ENCOUNTER — Ambulatory Visit: Payer: BC Managed Care – PPO | Admitting: Internal Medicine

## 2018-11-30 VITALS — BP 105/78 | HR 93 | Temp 98.6°F | Ht 63.78 in | Wt 169.8 lb

## 2018-11-30 DIAGNOSIS — Z0000 Encounter for general adult medical examination without abnormal findings: Secondary | ICD-10-CM

## 2018-11-30 DIAGNOSIS — F419 Anxiety disorder, unspecified: Secondary | ICD-10-CM | POA: Diagnosis not present

## 2018-11-30 DIAGNOSIS — G43909 Migraine, unspecified, not intractable, without status migrainosus: Secondary | ICD-10-CM

## 2018-11-30 DIAGNOSIS — Z1389 Encounter for screening for other disorder: Secondary | ICD-10-CM

## 2018-11-30 DIAGNOSIS — F329 Major depressive disorder, single episode, unspecified: Secondary | ICD-10-CM

## 2018-11-30 DIAGNOSIS — R490 Dysphonia: Secondary | ICD-10-CM | POA: Diagnosis not present

## 2018-11-30 DIAGNOSIS — Z1329 Encounter for screening for other suspected endocrine disorder: Secondary | ICD-10-CM

## 2018-11-30 DIAGNOSIS — E559 Vitamin D deficiency, unspecified: Secondary | ICD-10-CM

## 2018-11-30 DIAGNOSIS — J45909 Unspecified asthma, uncomplicated: Secondary | ICD-10-CM

## 2018-11-30 DIAGNOSIS — F32A Depression, unspecified: Secondary | ICD-10-CM

## 2018-11-30 DIAGNOSIS — Z13220 Encounter for screening for lipoid disorders: Secondary | ICD-10-CM

## 2018-11-30 DIAGNOSIS — K219 Gastro-esophageal reflux disease without esophagitis: Secondary | ICD-10-CM | POA: Diagnosis not present

## 2018-11-30 MED ORDER — ALBUTEROL SULFATE HFA 108 (90 BASE) MCG/ACT IN AERS: 1.0000 | INHALATION_SPRAY | Freq: Four times a day (QID) | RESPIRATORY_TRACT | 12 refills | Status: DC | PRN

## 2018-11-30 MED ORDER — ONDANSETRON HCL 4 MG PO TABS: 4.0000 mg | ORAL_TABLET | Freq: Two times a day (BID) | ORAL | 2 refills | Status: DC | PRN

## 2018-11-30 MED ORDER — SUMATRIPTAN SUCCINATE 25 MG PO TABS: ORAL_TABLET | ORAL | 2 refills | Status: DC

## 2018-11-30 MED ORDER — VENLAFAXINE HCL ER 37.5 MG PO CP24: 37.5000 mg | ORAL_CAPSULE | Freq: Every day | ORAL | 0 refills | Status: DC

## 2018-11-30 NOTE — Progress Notes (Signed)
Chief Complaint  Patient presents with  . Establish Care    laryngitis, medication discussion, why does she stay sick   New patient 1. Anxiety/depression on effexor 75 mg qd xr wants to taper off this medication doing therapy and doing ok  2. Asthma controlled, allergic rhinitis flonase is helping does not take otc antihistamine needs refill of Albuterol  3. Migraines 1x per week and h/a 2-3 x per week with nausea sleeping 6-7 hrs drinking coffee in the am and seeing stars before migraines no funny taste  4. GERD  5. 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  HENT: Positive for sore throat.        +hoarseness    Eyes: Negative for blurred vision.  Respiratory: Negative for shortness of breath.   Cardiovascular: Negative for chest pain.  Gastrointestinal: Positive for heartburn and nausea.  Skin: Negative for rash.  Psychiatric/Behavioral: Negative for depression.   Past Medical History:  Diagnosis Date  . Anxiety   . Asthma   . Depression   . GERD (gastroesophageal reflux disease)    in past  . Heart murmur    as infant  . HSV infection   . Migraine    Past Surgical History:  Procedure Laterality Date  . TONSILLECTOMY Bilateral 04/07/2016   Procedure: TONSILLECTOMY;  Surgeon: Geanie Logan, MD;  Location: [facility];  Service: ENT;  Laterality: Bilateral;  . TONSILLECTOMY    . WISDOM TOOTH EXTRACTION     Family History  Problem Relation Age of Onset  . Diabetes Mother   . Hypertension Mother   . Alcohol abuse Father   . Asthma Father   . Depression Father   . Depression Sister    Social History   Socioeconomic History  . Marital status: Single    Spouse name: Not on file  . Number of children: Not on file  . Years of education: Not on file  . Highest education level: Not on file   Occupational History  . Not on file  Social Needs  . Financial resource strain: Not on file  . Food insecurity:    Worry: Not on file    Inability: Not on file  . Transportation needs:    Medical: Not on file    Non-medical: Not on file  Tobacco Use  . Smoking status: Never Smoker  . Smokeless tobacco: Never Used  Substance and Sexual Activity  . Alcohol use: Yes    Comment: rarely  . Drug use: Never  . Sexual activity: Yes    Birth control/protection: Pill  Lifestyle  . Physical activity:    Days per week: Not on file    Minutes per session: Not on file  . Stress: Not on file  Relationships  . Social connections:    Talks on phone: Not on file    Gets together: Not on file    Attends religious service: Not on file    Active member of club or organization: Not on file    Attends meetings of clubs or organizations: Not on file    Relationship status: Not on file  . Intimate partner violence:    Fear of current or ex partner: Not on file    Emotionally abused: Not on file    Physically abused: Not on file    Forced sexual activity:  Not on file  Other Topics Concern  . 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 fluticasone (FLONASE) 50 MCG/ACT nasal spray Place into both nostrils daily as needed for allergies or rhinitis.  . meloxicam (MOBIC) 15 MG tablet Take 15 mg by mouth daily.  . valACYclovir (VALTREX) 1000 MG tablet Take 1,000 mg by mouth 2 (two) times daily as needed.   . venlafaxine XR (EFFEXOR-XR) 37.5 MG 24 hr capsule Take 1 capsule (37.5 mg total) by mouth daily with breakfast.  . [DISCONTINUED] albuterol (PROVENTIL HFA;VENTOLIN HFA) 108 (90 Base) MCG/ACT inhaler Inhale into the lungs every 6 (six) hours as needed for wheezing or shortness of breath.  . [DISCONTINUED] venlafaxine XR  (EFFEXOR-XR) 75 MG 24 hr capsule Take by mouth.   No Known Allergies Recent Results (from the past 2160 hour(s))  Cytology - PAP     Status: None   Collection Time: 10/06/18 12:00 AM  Result Value Ref Range   Adequacy      Satisfactory for evaluation  endocervical/transformation zone component PRESENT.   Diagnosis      NEGATIVE FOR INTRAEPITHELIAL LESIONS OR MALIGNANCY.   Material Submitted CervicoVaginal Pap [ThinPrep Imaged]   POCT urine pregnancy     Status: Normal   Collection Time: 10/06/18  2:58 PM  Result Value Ref Range   Preg Test, Ur Negative Negative   Objective  Body mass index is 29.35 kg/m. Wt Readings from Last 3 Encounters:  11/30/18 169 lb 12.8 oz (77 kg)  11/07/18 173 lb (78.5 kg)  10/06/18 172 lb (78 kg)   Temp Readings from Last 3 Encounters:  11/30/18 98.6 F (37 C) (Oral)  11/07/18 98.5 F (36.9 C)  04/07/16 97.9 F (36.6 C)   BP Readings from Last 3 Encounters:  11/30/18 105/78  11/07/18 120/84  10/06/18 126/70   Pulse Readings from Last 3 Encounters:  11/30/18 93  11/07/18 (!) 103  10/06/18 (!) 104    Physical Exam Vitals signs and nursing note reviewed.  Constitutional:      Appearance: Normal appearance. She is well-developed and well-groomed.  HENT:     Head: Normocephalic and atraumatic.     Nose: Nose normal.     Mouth/Throat:     Mouth: Mucous membranes are moist.     Pharynx: Oropharynx is clear.  Eyes:     Conjunctiva/sclera: Conjunctivae normal.     Pupils: Pupils are equal, round, and reactive to light.  Cardiovascular:     Rate and Rhythm: Normal rate and regular rhythm.     Heart sounds: Murmur present.  Pulmonary:     Effort: Pulmonary effort is normal.     Breath sounds: Normal breath sounds.  Skin:    General: Skin is warm and dry.  Neurological:     General: No focal deficit present.     Mental Status: She is alert and oriented to person, place, and time. Mental status is at baseline.     Gait: Gait normal.   Psychiatric:        Attention and Perception: Attention and perception normal.        Mood and Affect: Mood and affect normal.        Speech: Speech normal.  Behavior: Behavior normal. Behavior is cooperative.        Thought Content: Thought content normal.        Cognition and Memory: Cognition and memory normal.        Judgment: Judgment normal.     Assessment   1. Anxiety/depression  2. Asthma controlled, allergic rhinitis  3. Migraines  4. GERD  5. Hoarseness.laryngitis and sore throat s/p tonsillectomy with h/o GERD ? 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 HM Plan   1.  Reduced effexor 75 mg to 37.5 mg x 1 week then stop  Monitor  2.  Prn Albuterol  3.  imitrex and mobic prn  Magnesium 400 mg daily  Vitamin B2 200 mg bid  rec eye exam  4. pepcid 20 mg qd  5.  Refer to ENT See #4 consider PPI otc if H2 blocker does not help 6. On prn Valtrex  7.  sch fasting labs   Declines flu shot  Tdap per pt had 12/2014  Pap 10/06/2018 negative   Declines STD   Provider: Dr. French Ana McLean-Scocuzza-[facility]

## 2018-11-30 NOTE — Patient Instructions (Addendum)
Pepcid AC or if doesn't work Prilosec Magnesium 400 mg daily headaches Vitamin  B2 200 mg bid    Migraine Headache A migraine headache is an intense, throbbing pain on one side or both sides of the head. Migraines may also cause other symptoms, such as nausea, vomiting, and sensitivity to light and noise. What are the causes? Doing or taking certain things may also trigger migraines, such as:  Alcohol.  Smoking.  Medicines, such as: ? Medicine used to treat chest pain (nitroglycerine). ? Birth control pills. ? Estrogen pills. ? Certain blood pressure medicines.  Aged cheeses, chocolate, or caffeine.  Foods or drinks that contain nitrates, glutamate, aspartame, or tyramine.  Physical activity. Other things that may trigger a migraine include:  Menstruation.  Pregnancy.  Hunger.  Stress, lack of sleep, too much sleep, or fatigue.  Weather changes. What increases the risk? The following factors may make you more likely to experience migraine headaches:  Age. Risk increases with age.  Family history of migraine headaches.  Being Caucasian.  Depression and anxiety.  Obesity.  Being a woman.  Having a hole in the heart (patent foramen ovale) or other heart problems. What are the signs or symptoms? The main symptom of this condition is pulsating or throbbing pain. Pain may:  Happen in any area of the head, such as on one side or both sides.  Interfere with daily activities.  Get worse with physical activity.  Get worse with exposure to bright lights or loud noises. Other symptoms may include:  Nausea.  Vomiting.  Dizziness.  General sensitivity to bright lights, loud noises, or smells. Before you get a migraine, you may get warning signs that a migraine is developing (aura). An aura may include:  Seeing flashing lights or having blind spots.  Seeing bright spots, halos, or zigzag lines.  Having tunnel vision or blurred vision.  Having numbness  or a tingling feeling.  Having trouble talking.  Having muscle weakness. How is this diagnosed? A migraine headache can be diagnosed based on:  Your symptoms.  A physical exam.  Tests, such as CT scan or MRI of the head. These imaging tests can help rule out other causes of headaches.  Taking fluid from the spine (lumbar puncture) and analyzing it (cerebrospinal fluid analysis, or CSF analysis). How is this treated? A migraine headache is usually treated with medicines that:  Relieve pain.  Relieve nausea.  Prevent migraines from coming back. Treatment may also include:  Acupuncture.  Lifestyle changes like avoiding foods that trigger migraines. Follow these instructions at home: Medicines  Take over-the-counter and prescription medicines only as told by your health care provider.  Do not drive or use heavy machinery while taking prescription pain medicine.  To prevent or treat constipation while you are taking prescription pain medicine, your health care provider may recommend that you: ? Drink enough fluid to keep your urine clear or pale yellow. ? Take over-the-counter or prescription medicines. ? Eat foods that are high in fiber, such as fresh fruits and vegetables, whole grains, and beans. ? Limit foods that are high in fat and processed sugars, such as fried and sweet foods. Lifestyle  Avoid alcohol use.  Do not use any products that contain nicotine or tobacco, such as cigarettes and e-cigarettes. If you need help quitting, ask your health care provider.  Get at least 8 hours of sleep every night.  Limit your stress. General instructions      Keep a journal to find out  what may trigger your migraine headaches. For example, write down: ? What you eat and drink. ? How much sleep you get. ? Any change to your diet or medicines.  If you have a migraine: ? Avoid things that make your symptoms worse, such as bright lights. ? It may help to lie down in a  dark, quiet room. ? Do not drive or use heavy machinery. ? Ask your health care provider what activities are safe for you while you are experiencing symptoms.  Keep all follow-up visits as told by your health care provider. This is important. Contact a health care provider if:  You develop symptoms that are different or more severe than your usual migraine symptoms. Get help right away if:  Your migraine becomes severe.  You have a fever.  You have a stiff neck.  You have vision loss.  Your muscles feel weak or like you cannot control them.  You start to lose your balance often.  You develop trouble walking.  You faint. This information is not intended to replace advice given to you by your health care provider. Make sure you discuss any questions you have with your health care provider. Document Released: 09/14/2005 Document Revised: 04/03/2016 Document Reviewed: 03/02/2016 Elsevier Interactive Patient Education  2019 Elsevier Inc.   Hoarseness  Hoarseness, also called dysphonia, is any abnormal change in your voice that can make it difficult to speak. Your voice may sound raspy, breathy, or strained. Hoarseness is caused by a problem with your vocal cords (vocal folds). These are two bands of tissue inside your voice box (larynx). When you speak, your vocal cords move back and forth to create sound. The surfaces of your vocal cords need to be smooth for your voice to sound clear. Swelling or lumps on your vocal cords can cause hoarseness. Common causes of vocal cord problems include:  Infection in the nose, throat, and upper air passages (upper respiratory infection).  A long-term cough.  Straining or overusing your voice.  Smoking, or exposure to secondhand smoke.  Allergies.  Medication side effects.  Vocal cord growths.  Vocal cord injuries.  Stomach acids that move up in your throat and irritate your vocal cords (gastroesophageal reflux).  Diseases that  affect the nervous system, such as a stroke or Parkinson's disease. Follow these instructions at home: Watch your condition for any changes. To ease discomfort and protect your vocal cords:  Rest your voice.  Do not whisper. Whispering can cause muscle strain.  Do not speak in a loud or harsh voice.  Avoid coughing or clearing your throat.  Do not use any products that contain nicotine or tobacco, such as cigarettes and e-cigarettes. If you need help quitting, ask your health care provider.  Avoid secondhand smoke.  Do not eat foods that give you heartburn, such as spicy or acidic foods like hot peppers and orange juice. Heartburn can make gastroesophageal reflux worse.  Do not drink beverages that contain caffeine (coffee, tea, or soft drinks) or alcohol (beer, wine, or liquor).  Drink enough fluid to keep your urine pale yellow.  Use a humidifier if the air in your home is dry. If recommended by your health care provider, schedule an appointment with a speech-language specialist. This specialist may give you methods to try that can help you avoid misusing your voice. Contact a health care provider if:  You have hoarseness that lasts longer than 3 weeks.  You almost lose or completely lose your voice for more than  3 days.  You have pain when you swallow or try to talk.  You feel a lump in your neck. Get help right away if:  You have trouble swallowing.  You feel like you are choking when you swallow.  You cough up blood or vomit blood.  You have trouble breathing.  You choke, cannot swallow, or cannot breathe if you lie flat.  You notice swelling or a rash on your body, face, or tongue. Summary  Hoarseness, also called dysphonia, is any abnormal change in your voice that can make it difficult to speak. Your voice may sound raspy, breathy, or strained.  Hoarseness is caused by a problem with your vocal cords (vocal folds).  Do not speak in a loud or harsh voice,  use nicotine or tobacco products, or eat foods that give you heartburn.  If recommended by your health care provider, meet with a speech-language specialist. This information is not intended to replace advice given to you by your health care provider. Make sure you discuss any questions you have with your health care provider. Document Released: 08/28/2005 Document Revised: 06/11/2017 Document Reviewed: 06/11/2017 Elsevier Interactive Patient Education  2019 Elsevier Inc.  Gastroesophageal Reflux Disease, Adult Gastroesophageal reflux (GER) happens when acid from the stomach flows up into the tube that connects the mouth and the stomach (esophagus). Normally, food travels down the esophagus and stays in the stomach to be digested. However, when a person has GER, food and stomach acid sometimes move back up into the esophagus. If this becomes a more serious problem, the person may be diagnosed with a disease called gastroesophageal reflux disease (GERD). GERD occurs when the reflux:  Happens often.  Causes frequent or severe symptoms.  Causes problems such as damage to the esophagus. When stomach acid comes in contact with the esophagus, the acid may cause soreness (inflammation) in the esophagus. Over time, GERD may create small holes (ulcers) in the lining of the esophagus. What are the causes? This condition is caused by a problem with the muscle between the esophagus and the stomach (lower esophageal sphincter, or LES). Normally, the LES muscle closes after food passes through the esophagus to the stomach. When the LES is weakened or abnormal, it does not close properly, and that allows food and stomach acid to go back up into the esophagus. The LES can be weakened by certain dietary substances, medicines, and medical conditions, including:  Tobacco use.  Pregnancy.  Having a hiatal hernia.  Alcohol use.  Certain foods and beverages, such as coffee, chocolate, onions, and  peppermint. What increases the risk? You are more likely to develop this condition if you:  Have an increased body weight.  Have a connective tissue disorder.  Use NSAID medicines. What are the signs or symptoms? Symptoms of this condition include:  Heartburn.  Difficult or painful swallowing.  The feeling of having a lump in the throat.  Abitter taste in the mouth.  Bad breath.  Having a large amount of saliva.  Having an upset or bloated stomach.  Belching.  Chest pain. Different conditions can cause chest pain. Make sure you see your health care provider if you experience chest pain.  Shortness of breath or wheezing.  Ongoing (chronic) cough or a night-time cough.  Wearing away of tooth enamel.  Weight loss. How is this diagnosed? Your health care provider will take a medical history and perform a physical exam. To determine if you have mild or severe GERD, your health care provider may  also monitor how you respond to treatment. You may also have tests, including:  A test to examine your stomach and esophagus with a small camera (endoscopy).  A test thatmeasures the acidity level in your esophagus.  A test thatmeasures how much pressure is on your esophagus.  A barium swallow or modified barium swallow test to show the shape, size, and functioning of your esophagus. How is this treated? The goal of treatment is to help relieve your symptoms and to prevent complications. Treatment for this condition may vary depending on how severe your symptoms are. Your health care provider may recommend:  Changes to your diet.  Medicine.  Surgery. Follow these instructions at home: Eating and drinking   Follow a diet as recommended by your health care provider. This may involve avoiding foods and drinks such as: ? Coffee and tea (with or without caffeine). ? Drinks that containalcohol. ? Energy drinks and sports drinks. ? Carbonated drinks or sodas. ? Chocolate  and cocoa. ? Peppermint and mint flavorings. ? Garlic and onions. ? Horseradish. ? Spicy and acidic foods, including peppers, chili powder, curry powder, vinegar, hot sauces, and barbecue sauce. ? Citrus fruit juices and citrus fruits, such as oranges, lemons, and limes. ? Tomato-based foods, such as red sauce, chili, salsa, and pizza with red sauce. ? Fried and fatty foods, such as donuts, french fries, potato chips, and high-fat dressings. ? High-fat meats, such as hot dogs and fatty cuts of red and white meats, such as rib eye steak, sausage, ham, and bacon. ? High-fat dairy items, such as whole milk, butter, and cream cheese.  Eat small, frequent meals instead of large meals.  Avoid drinking large amounts of liquid with your meals.  Avoid eating meals during the 2-3 hours before bedtime.  Avoid lying down right after you eat.  Do not exercise right after you eat. Lifestyle   Do not use any products that contain nicotine or tobacco, such as cigarettes, e-cigarettes, and chewing tobacco. If you need help quitting, ask your health care provider.  Try to reduce your stress by using methods such as yoga or meditation. If you need help reducing stress, ask your health care provider.  If you are overweight, reduce your weight to an amount that is healthy for you. Ask your health care provider for guidance about a safe weight loss goal. General instructions  Pay attention to any changes in your symptoms.  Take over-the-counter and prescription medicines only as told by your health care provider. Do not take aspirin, ibuprofen, or other NSAIDs unless your health care provider told you to do so.  Wear loose-fitting clothing. Do not wear anything tight around your waist that causes pressure on your abdomen.  Raise (elevate) the head of your bed about 6 inches (15 cm).  Avoid bending over if this makes your symptoms worse.  Keep all follow-up visits as told by your health care  provider. This is important. Contact a health care provider if:  You have: ? New symptoms. ? Unexplained weight loss. ? Difficulty swallowing or it hurts to swallow. ? Wheezing or a persistent cough. ? A hoarse voice.  Your symptoms do not improve with treatment. Get help right away if you:  Have pain in your arms, neck, jaw, teeth, or back.  Feel sweaty, dizzy, or light-headed.  Have chest pain or shortness of breath.  Vomit and your vomit looks like blood or coffee grounds.  Faint.  Have stool that is bloody or black.  Cannot swallow, drink, or eat. Summary  Gastroesophageal reflux happens when acid from the stomach flows up into the esophagus. GERD is a disease in which the reflux happens often, causes frequent or severe symptoms, or causes problems such as damage to the esophagus.  Treatment for this condition may vary depending on how severe your symptoms are. Your health care provider may recommend diet and lifestyle changes, medicine, or surgery.  Contact a health care provider if you have new or worsening symptoms.  Take over-the-counter and prescription medicines only as told by your health care provider. Do not take aspirin, ibuprofen, or other NSAIDs unless your health care provider told you to do so.  Keep all follow-up visits as told by your health care provider. This is important. This information is not intended to replace advice given to you by your health care provider. Make sure you discuss any questions you have with your health care provider. Document Released: 06/24/2005 Document Revised: 03/23/2018 Document Reviewed: 03/23/2018 Elsevier Interactive Patient Education  2019 [employer].  Food Choices for Gastroesophageal Reflux Disease, Adult When you have gastroesophageal reflux disease (GERD), the foods you eat and your eating habits are very important. Choosing the right foods can help ease the discomfort of GERD. Consider working with a diet and  nutrition specialist (dietitian) to help you make healthy food choices. What general guidelines should I follow?  Eating plan  Choose healthy foods low in fat, such as fruits, vegetables, whole grains, low-fat dairy products, and lean meat, fish, and poultry.  Eat frequent, small meals instead of three large meals each day. Eat your meals slowly, in a relaxed setting. Avoid bending over or lying down until 2-3 hours after eating.  Limit high-fat foods such as fatty meats or fried foods.  Limit your intake of oils, butter, and shortening to less than 8 teaspoons each day.  Avoid the following: ? Foods that cause symptoms. These may be different for different people. Keep a food diary to keep track of foods that cause symptoms. ? Alcohol. ? Drinking large amounts of liquid with meals. ? Eating meals during the 2-3 hours before bed.  Cook foods using methods other than frying. This may include baking, grilling, or broiling. Lifestyle  Maintain a healthy weight. Ask your health care provider what weight is healthy for you. If you need to lose weight, work with your health care provider to do so safely.  Exercise for at least 30 minutes on 5 or more days each week, or as told by your health care provider.  Avoid wearing clothes that fit tightly around your waist and chest.  Do not use any products that contain nicotine or tobacco, such as cigarettes and e-cigarettes. If you need help quitting, ask your health care provider.  Sleep with the head of your bed raised. Use a wedge under the mattress or blocks under the bed frame to raise the head of the bed. What foods are not recommended? The items listed may not be a complete list. Talk with your dietitian about what dietary choices are best for you. Grains Pastries or quick breads with added fat. [place] toast. Vegetables Deep fried vegetables. [place] fries. Any vegetables prepared with added fat. Any vegetables that cause symptoms. For  some people this may include tomatoes and tomato products, chili peppers, onions and garlic, and horseradish. Fruits Any fruits prepared with added fat. Any fruits that cause symptoms. For some people this may include citrus fruits, such as oranges, grapefruit, pineapple, and  lemons. Meats and other protein foods High-fat meats, such as fatty beef or pork, hot dogs, ribs, ham, sausage, salami and bacon. Fried meat or protein, including fried fish and fried chicken. Nuts and nut butters. Dairy Whole milk and chocolate milk. Sour cream. Cream. Ice cream. Cream cheese. Milk shakes. Beverages Coffee and tea, with or without caffeine. Carbonated beverages. Sodas. Energy drinks. Fruit juice made with acidic fruits (such as orange or grapefruit). Tomato juice. Alcoholic drinks. Fats and oils Butter. Margarine. Shortening. Ghee. Sweets and desserts Chocolate and cocoa. Donuts. Seasoning and other foods Pepper. Peppermint and spearmint. Any condiments, herbs, or seasonings that cause symptoms. For some people, this may include curry, hot sauce, or vinegar-based salad dressings. Summary  When you have gastroesophageal reflux disease (GERD), food and lifestyle choices are very important to help ease the discomfort of GERD.  Eat frequent, small meals instead of three large meals each day. Eat your meals slowly, in a relaxed setting. Avoid bending over or lying down until 2-3 hours after eating.  Limit high-fat foods such as fatty meat or fried foods. This information is not intended to replace advice given to you by your health care provider. Make sure you discuss any questions you have with your health care provider. Document Released: 09/14/2005 Document Revised: 09/15/2016 Document Reviewed: 09/15/2016 Elsevier Interactive Patient Education  2019 [employer]

## 2018-12-06 ENCOUNTER — Other Ambulatory Visit (INDEPENDENT_AMBULATORY_CARE_PROVIDER_SITE_OTHER): Payer: BC Managed Care – PPO

## 2018-12-06 ENCOUNTER — Encounter: Payer: Self-pay | Admitting: Internal Medicine

## 2018-12-06 DIAGNOSIS — Z0000 Encounter for general adult medical examination without abnormal findings: Secondary | ICD-10-CM

## 2018-12-06 DIAGNOSIS — Z1329 Encounter for screening for other suspected endocrine disorder: Secondary | ICD-10-CM | POA: Diagnosis not present

## 2018-12-06 DIAGNOSIS — Z13220 Encounter for screening for lipoid disorders: Secondary | ICD-10-CM | POA: Diagnosis not present

## 2018-12-06 DIAGNOSIS — E559 Vitamin D deficiency, unspecified: Secondary | ICD-10-CM

## 2018-12-06 DIAGNOSIS — Z1389 Encounter for screening for other disorder: Secondary | ICD-10-CM

## 2018-12-06 LAB — CBC WITH DIFFERENTIAL/PLATELET
BASOS ABS: 0 10*3/uL (ref 0.0–0.1)
Basophils Relative: 0.6 % (ref 0.0–3.0)
EOS PCT: 2.2 % (ref 0.0–5.0)
Eosinophils Absolute: 0.2 10*3/uL (ref 0.0–0.7)
HCT: 40.1 % (ref 36.0–46.0)
Hemoglobin: 13.5 g/dL (ref 12.0–15.0)
LYMPHS ABS: 4 10*3/uL (ref 0.7–4.0)
Lymphocytes Relative: 53.5 % — ABNORMAL HIGH (ref 12.0–46.0)
MCHC: 33.6 g/dL (ref 30.0–36.0)
MCV: 94.4 fl (ref 78.0–100.0)
MONOS PCT: 8.5 % (ref 3.0–12.0)
Monocytes Absolute: 0.6 10*3/uL (ref 0.1–1.0)
NEUTROS ABS: 2.6 10*3/uL (ref 1.4–7.7)
NEUTROS PCT: 35.2 % — AB (ref 43.0–77.0)
PLATELETS: 304 10*3/uL (ref 150.0–400.0)
RBC: 4.25 Mil/uL (ref 3.87–5.11)
RDW: 12.7 % (ref 11.5–15.5)
WBC: 7.5 10*3/uL (ref 4.0–10.5)

## 2018-12-06 LAB — COMPREHENSIVE METABOLIC PANEL
ALT: 25 U/L (ref 0–35)
AST: 22 U/L (ref 0–37)
Albumin: 4.2 g/dL (ref 3.5–5.2)
Alkaline Phosphatase: 61 U/L (ref 39–117)
BUN: 10 mg/dL (ref 6–23)
CO2: 27 meq/L (ref 19–32)
Calcium: 9.6 mg/dL (ref 8.4–10.5)
Chloride: 105 mEq/L (ref 96–112)
Creatinine, Ser: 0.65 mg/dL (ref 0.40–1.20)
GFR: 137.38 mL/min (ref >60.00)
GLUCOSE: 86 mg/dL (ref 70–99)
POTASSIUM: 4.5 meq/L (ref 3.5–5.1)
Sodium: 138 mEq/L (ref 135–145)
Total Bilirubin: 0.2 mg/dL (ref 0.2–1.2)
Total Protein: 6.6 g/dL (ref 6.0–8.3)

## 2018-12-06 LAB — LIPID PANEL
CHOL/HDL RATIO: 2
Cholesterol: 147 mg/dL (ref 0–200)
HDL: 75.9 mg/dL (ref >39.00)
LDL Cholesterol: 64 mg/dL (ref 0–99)
NonHDL: 70.89
TRIGLYCERIDES: 36 mg/dL (ref 0.0–149.0)
VLDL: 7.2 mg/dL (ref 0.0–40.0)

## 2018-12-06 LAB — T4, FREE: FREE T4: 0.7 ng/dL (ref 0.60–1.60)

## 2018-12-06 LAB — TSH: TSH: 2.04 u[IU]/mL (ref 0.35–4.50)

## 2018-12-06 LAB — VITAMIN D 25 HYDROXY (VIT D DEFICIENCY, FRACTURES): VITD: 12 ng/mL — ABNORMAL LOW (ref 30.00–100.00)

## 2018-12-06 NOTE — Progress Notes (Signed)
Latest in [facility] has been added.  Patient not UTD

## 2018-12-06 NOTE — Addendum Note (Signed)
Addended by: Penne Lash on: 12/06/2018 08:05 AM   Modules accepted: Orders

## 2018-12-07 ENCOUNTER — Other Ambulatory Visit: Payer: Self-pay | Admitting: Internal Medicine

## 2018-12-07 DIAGNOSIS — F419 Anxiety disorder, unspecified: Principal | ICD-10-CM

## 2018-12-07 DIAGNOSIS — F32A Depression, unspecified: Secondary | ICD-10-CM

## 2018-12-07 DIAGNOSIS — F329 Major depressive disorder, single episode, unspecified: Secondary | ICD-10-CM

## 2018-12-07 LAB — URINALYSIS, ROUTINE W REFLEX MICROSCOPIC
Bilirubin, UA: NEGATIVE
Glucose, UA: NEGATIVE
Ketones, UA: NEGATIVE
Leukocytes, UA: NEGATIVE
NITRITE UA: NEGATIVE
PH UA: 6 (ref 5.0–7.5)
Protein, UA: NEGATIVE
RBC, UA: NEGATIVE
Specific Gravity, UA: 1.02 (ref 1.005–1.030)
Urobilinogen, Ur: 0.2 mg/dL (ref 0.2–1.0)

## 2018-12-07 NOTE — Telephone Encounter (Signed)
Requested medication (s) are due for refill today: yes  Requested medication (s) are on the active medication list: yes  Last refill:  11/30/18 #7 tabs  Future visit scheduled: yes   Notes to clinic:  Pt wanting to taper dose sending to office to approve refill. OV 11/30/18:  "1. Anxiety/depression on effexor 75 mg qd xr wants to taper off this medication doing therapy and doing ok "  Requested Prescriptions  Pending Prescriptions Disp Refills   venlafaxine XR (EFFEXOR-XR) 37.5 MG 24 hr capsule 7 capsule 0    Sig: Take 1 capsule (37.5 mg total) by mouth daily with breakfast.     Psychiatry: Antidepressants - SNRI - desvenlafaxine & venlafaxine Failed - 12/07/2018  3:38 PM      Failed - Completed PHQ-2 or PHQ-9 in the last 360 days.      Passed - LDL in normal range and within 360 days    LDL Cholesterol  Date Value Ref Range Status  12/06/2018 64 0 - 99 mg/dL Final         Passed - Total Cholesterol in normal range and within 360 days    Cholesterol  Date Value Ref Range Status  12/06/2018 147 0 - 200 mg/dL Final    Comment:    ATP III Classification       Desirable:  < 200 mg/dL               Borderline High:  200 - 239 mg/dL          High:  > = 875 mg/dL         Passed - Triglycerides in normal range and within 360 days    Triglycerides  Date Value Ref Range Status  12/06/2018 36.0 0.0 - 149.0 mg/dL Final    Comment:    Normal:  <150 mg/dLBorderline High:  150 - 199 mg/dL         Passed - Last BP in normal range    BP Readings from Last 1 Encounters:  11/30/18 105/78         Passed - Valid encounter within last 6 months    Recent Outpatient Visits          1 week ago Anxiety and depression   [facility] McLean-Scocuzza, Pasty Spillers, MD      Future Appointments            In 3 months McLean-Scocuzza, Pasty Spillers, MD [facility] [place], [facility]

## 2018-12-07 NOTE — Telephone Encounter (Signed)
Copied from CRM [id number]. Topic: Quick Communication - Rx Refill/Question >> Dec 07, 2018  3:36 PM Angela Nevin wrote: Medication: venlafaxine XR (EFFEXOR-XR) 37.5 MG 24 hr capsule   Has the patient contacted their pharmacy? Yes, they state they sent request x2 days ago  Preferred Pharmacy (with phone number or street name):[id number] [place], [place] - [address] AT [facility] [address] [phone] (Phone) [phone] (Fax)

## 2018-12-08 ENCOUNTER — Other Ambulatory Visit: Payer: Self-pay | Admitting: Internal Medicine

## 2018-12-08 ENCOUNTER — Telehealth: Payer: Self-pay

## 2018-12-08 MED ORDER — VENLAFAXINE HCL ER 75 MG PO CP24: 75.0000 mg | ORAL_CAPSULE | Freq: Every day | ORAL | 0 refills | Status: DC

## 2018-12-08 NOTE — Telephone Encounter (Signed)
Copied from CRM [id number]. Topic: Quick Communication - Rx Refill/Question >> Dec 07, 2018  3:36 PM Angela Nevin wrote: Medication: venlafaxine XR (EFFEXOR-XR) 37.5 MG 24 hr capsule   Has the patient contacted their pharmacy? Yes, they state they sent request x2 days ago  Preferred Pharmacy (with phone number or street name):[id number] [place], [place] - [address] AT [facility] [address] [phone] (Phone) [phone] (Fax) >> Dec 08, 2018  3:50 PM Floria Raveling A wrote: Pt called in and would like to know if Dr Shirlee Latch could give her another 30 days of this med.  She stated she is not ready to get off of it.  She is having side effects from coming off to quick.  Having really bad headaches, for irritable and mood has been down.    Pharmacy - [facility] on [address]

## 2018-12-08 NOTE — Progress Notes (Signed)
rx ok'd for effexor 75mg  #30 with no refills.

## 2018-12-08 NOTE — Telephone Encounter (Signed)
Spoke to pt.  She has been on effexor 75mg  q day.  Wanted to taper off.  Has been on effexor 37.5mg  q day for one week and does not feel well. Feels tapering too fast.  Discussed taper and adjusting to a slower taper.  With her increased stress with her job, etc, she would prefer to go back on 75mg  q day for now and try to taper during the summer when school is out.  rx sent in for effexor 75mg  #30 with no refills.  I told her I would forward information to you for further refills with plans for taper in future.

## 2018-12-09 ENCOUNTER — Other Ambulatory Visit: Payer: Self-pay | Admitting: Internal Medicine

## 2018-12-09 DIAGNOSIS — G43909 Migraine, unspecified, not intractable, without status migrainosus: Secondary | ICD-10-CM

## 2018-12-09 MED ORDER — SUMATRIPTAN SUCCINATE 25 MG PO TABS: ORAL_TABLET | ORAL | 2 refills | Status: DC

## 2018-12-09 NOTE — Telephone Encounter (Signed)
Copied from CRM [id number]. Topic: Quick Communication - Rx Refill/Question >> Dec 09, 2018  3:14 PM Lyn Hollingshead, [facility] wrote: Medication: SUMAtriptan (IMITREX) 25 MG tablet  Pt states that her pharmacy doesn't have this and would like the script resent.  Preferred Pharmacy (with phone number or street name): [id number] [place], [place] - [address] AT [facility] OF [address] [phone] (Phone) [phone] (Fax)  Agent: Please be advised that RX refills may take up to 3 business days. We ask that you follow-up with your pharmacy.

## 2018-12-12 ENCOUNTER — Telehealth: Payer: Self-pay | Admitting: Internal Medicine

## 2018-12-12 ENCOUNTER — Other Ambulatory Visit: Payer: Self-pay | Admitting: Internal Medicine

## 2018-12-12 ENCOUNTER — Encounter: Payer: Self-pay | Admitting: Internal Medicine

## 2018-12-12 DIAGNOSIS — F419 Anxiety disorder, unspecified: Secondary | ICD-10-CM

## 2018-12-12 DIAGNOSIS — E559 Vitamin D deficiency, unspecified: Secondary | ICD-10-CM

## 2018-12-12 DIAGNOSIS — J45909 Unspecified asthma, uncomplicated: Secondary | ICD-10-CM

## 2018-12-12 MED ORDER — CHOLECALCIFEROL 1.25 MG (50000 UT) PO CAPS: 50000.0000 [IU] | ORAL_CAPSULE | ORAL | 1 refills | Status: DC

## 2018-12-12 MED ORDER — VENLAFAXINE HCL ER 75 MG PO CP24: 75.0000 mg | ORAL_CAPSULE | Freq: Every day | ORAL | 5 refills | Status: DC

## 2018-12-12 MED ORDER — VENLAFAXINE HCL ER 37.5 MG PO CP24: 37.5000 mg | ORAL_CAPSULE | Freq: Every day | ORAL | 2 refills | Status: DC

## 2018-12-12 NOTE — Telephone Encounter (Signed)
Reviewed will hold taper for now   TMS

## 2018-12-12 NOTE — Telephone Encounter (Signed)
Please inform pt this is 2nd time she has called about tapering dose or not of the effexor  Please be sure she wants to taper I have sent effexor xr 37.5 daily enough of the dose if she feels she needs to stay on for her anxiety please do   Thanks TMS

## 2018-12-12 NOTE — Telephone Encounter (Signed)
Attempted to call pt for lab results but no answer at this time. Unable to leave message on voicemail.

## 2018-12-12 NOTE — Telephone Encounter (Signed)
Copied from CRM [id number]. Topic: Quick Communication - Lab Results (Clinic Use ONLY) >> Dec 12, 2018  8:45 AM Allean Found, CMA wrote: Called patient to inform them of 12/06/2018 lab results. When patient returns call, triage nurse may disclose results.

## 2018-12-16 NOTE — Telephone Encounter (Signed)
Unable to leave message for patient to return call back, phone kept ringing. PEC may give results and obtain information.  

## 2018-12-16 NOTE — Telephone Encounter (Signed)
Requested medication (s) are due for refill today:yes  Requested medication (s) are on the active medication list: yes Last refill:  11/30/18 #7  Future visit scheduled: yes  Notes to clinic:  Pt is doing a taper down Please allow for provider to review   Requested Prescriptions  Pending Prescriptions Disp Refills   venlafaxine XR (EFFEXOR-XR) 37.5 MG 24 hr capsule 7 capsule 0    Sig: Take 1 capsule (37.5 mg total) by mouth daily with breakfast.     Psychiatry: Antidepressants - SNRI - desvenlafaxine & venlafaxine Failed - 12/16/2018  8:37 AM      Failed - Completed PHQ-2 or PHQ-9 in the last 360 days.      Passed - LDL in normal range and within 360 days    LDL Cholesterol  Date Value Ref Range Status  12/06/2018 64 0 - 99 mg/dL Final         Passed - Total Cholesterol in normal range and within 360 days    Cholesterol  Date Value Ref Range Status  12/06/2018 147 0 - 200 mg/dL Final    Comment:    ATP III Classification       Desirable:  < 200 mg/dL               Borderline High:  200 - 239 mg/dL          High:  > = 626 mg/dL         Passed - Triglycerides in normal range and within 360 days    Triglycerides  Date Value Ref Range Status  12/06/2018 36.0 0.0 - 149.0 mg/dL Final    Comment:    Normal:  <150 mg/dLBorderline High:  150 - 199 mg/dL         Passed - Last BP in normal range    BP Readings from Last 1 Encounters:  11/30/18 105/78         Passed - Valid encounter within last 6 months    Recent Outpatient Visits          2 weeks ago Anxiety and depression   [facility] [place] McLean-Scocuzza, Pasty Spillers, MD      Future Appointments            In 2 months McLean-Scocuzza, Pasty Spillers, MD [facility] [place], [facility]

## 2019-03-08 ENCOUNTER — Ambulatory Visit (INDEPENDENT_AMBULATORY_CARE_PROVIDER_SITE_OTHER): Payer: BC Managed Care – PPO | Admitting: Internal Medicine

## 2019-03-08 ENCOUNTER — Other Ambulatory Visit: Payer: Self-pay

## 2019-03-08 DIAGNOSIS — G43909 Migraine, unspecified, not intractable, without status migrainosus: Secondary | ICD-10-CM

## 2019-03-08 DIAGNOSIS — J45909 Unspecified asthma, uncomplicated: Secondary | ICD-10-CM | POA: Diagnosis not present

## 2019-03-08 DIAGNOSIS — E559 Vitamin D deficiency, unspecified: Secondary | ICD-10-CM

## 2019-03-08 DIAGNOSIS — F419 Anxiety disorder, unspecified: Secondary | ICD-10-CM

## 2019-03-08 MED ORDER — CHOLECALCIFEROL 1.25 MG (50000 UT) PO CAPS: 50000.0000 [IU] | ORAL_CAPSULE | ORAL | 0 refills | Status: DC

## 2019-03-08 MED ORDER — SUMATRIPTAN SUCCINATE 50 MG PO TABS: ORAL_TABLET | ORAL | 5 refills | Status: DC

## 2019-03-08 MED ORDER — ALBUTEROL SULFATE HFA 108 (90 BASE) MCG/ACT IN AERS: 1.0000 | INHALATION_SPRAY | Freq: Four times a day (QID) | RESPIRATORY_TRACT | 12 refills | Status: AC | PRN

## 2019-03-08 MED ORDER — ESCITALOPRAM OXALATE 10 MG PO TABS: 10.0000 mg | ORAL_TABLET | Freq: Every day | ORAL | 1 refills | Status: DC

## 2019-03-08 MED ORDER — ONDANSETRON HCL 4 MG PO TABS: 4.0000 mg | ORAL_TABLET | Freq: Two times a day (BID) | ORAL | 5 refills | Status: DC | PRN

## 2019-03-08 MED ORDER — VENLAFAXINE HCL ER 37.5 MG PO CP24: 37.5000 mg | ORAL_CAPSULE | Freq: Every day | ORAL | 2 refills | Status: DC

## 2019-03-08 NOTE — Progress Notes (Signed)
Virtual Visit via Video Note  I connected with Dana Beck  on 03/08/19 at  2:10 PM EDT by a video enabled telemedicine application and verified that I am speaking with the correct person using two identifiers.  Location patient: home Location provider:work  Persons participating in the virtual visit: patient, provider  I discussed the limitations of evaluation and management by telemedicine and the availability of in person appointments. The patient expressed understanding and agreed to proceed.   HPI: 1. C/o anxiety>depression and effexor 75 mg qd is not helping her friend is on lexapro 20 mg and she wants to try to transition to lexapro. She needs to schedule f/u appt with therapist as she prev goes 1x per month. She tries to journal to help anxiety but it has been increased recently  2. Hoarseness improved did not f/u with ENT 3. Asthma controlled  4. Reviewed labs 12/06/2018 she is taking D3 weekly  5. Migraines uncontrolled and she thinks related to stress she is drinking small cup of coffee iced. H/a llast 30 min to 1 hr and she is bothered by light and sound. She has had since teenager but freq is increasing. She has not had eye exam recently. Sleeping 8 hrs a day. No aura but is bothered by light and sound.  Imitrex 25 mg prn is not helping and she has been taking goody powder prn as well 2x per week    ROS: See pertinent positives and negatives per HPI.  Past Medical History:  Diagnosis Date  . Anxiety   . Asthma   . Depression   . GERD (gastroesophageal reflux disease)    in past  . Heart murmur    as infant  . 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 Migraine     Past Surgical History:  Procedure Laterality Date  . TONSILLECTOMY Bilateral 04/07/2016   Procedure: TONSILLECTOMY;  Surgeon: Clyde Canterbury, MD;  Location: [facility];  Service: ENT;  Laterality: Bilateral;  . TONSILLECTOMY    . WISDOM TOOTH EXTRACTION      Family History  Problem Relation  Age of Onset  . Diabetes Mother   . Hypertension Mother   . Alcohol abuse Father   . Asthma Father   . Depression Father   . 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  albuterol (PROAIR HFA) 108 (90 Base) MCG/ACT inhaler, Inhale 1-2 puffs into the lungs every 6 (six) hours as needed for wheezing or shortness of breath., Disp: 1 Inhaler, Rfl: 12 .  Cholecalciferol 1.25 MG (50000 UT) capsule, Take 1 capsule (50,000 Units total) by mouth once a week., Disp: 13 capsule, Rfl: 0 .  fluticasone (FLONASE) 50 MCG/ACT nasal spray, Place into both nostrils daily as needed for allergies or rhinitis., Disp: , Rfl:  .  meloxicam (MOBIC) 15 MG tablet, Take 15 mg by mouth daily., Disp: , Rfl:  .  ondansetron (ZOFRAN) 4 MG tablet, Take 1 tablet (4 mg total) by mouth 2 (two) times daily as needed for nausea or vomiting., Disp: 60 tablet, Rfl: 5 .  SUMAtriptan (IMITREX) 50 MG tablet, May repeat in 2 hours if headache persists or recurs. No more than 200 mg per day. No more than 2 x per week, Disp: 9 tablet, Rfl: 5 .  venlafaxine XR (  EFFEXOR XR) 37.5 MG 24 hr capsule, Take 1 capsule (37.5 mg total) by mouth daily with breakfast. Taper down 37.5 x 1 week then week 2 37.5 QOD and stop, Disp: 10 capsule, Rfl: 2 .  VIENVA 0.1-20 MG-MCG tablet, TK 1 T PO QD, Disp: , Rfl:  .  escitalopram (LEXAPRO) 10 MG tablet, Take 1 tablet (10 mg total) by mouth daily. Start 03/24/2019 in am, Disp: 90 tablet, Rfl: 1  EXAM:  VITALS per patient if applicable:  GENERAL: alert, oriented, appears well and in no acute distress  HEENT: atraumatic, conjunttiva clear, no obvious abnormalities on inspection of external nose and ears  NECK: normal movements of the head and neck  LUNGS: on inspection no signs of respiratory distress, breathing rate appears normal, no obvious  gross SOB, gasping or wheezing  CV: no obvious cyanosis  MS: moves all visible extremities without noticeable abnormality  PSYCH/NEURO: pleasant and cooperative, no obvious depression or anxiety, speech and thought processing grossly intact  ASSESSMENT AND PLAN:  Discussed the following assessment and plan:  Anxiety - Plan: venlafaxine XR (EFFEXOR XR) 37.5 MG 24 hr capsule qd x 1 week then qod then stop, escitalopram (LEXAPRO) 10 MG tablet to start 03/24/19 and rec f/u therapist  Migraine without status migrainosus, not intractable, unspecified migraine type - Plan: SUMAtriptan (IMITREX) 50 MG tablet, ondansetron (ZOFRAN) 4 MG tablet, Ambulatory referral to Neurology Dr. Lucia GaskinsAhern -disc BB and topamax today will defer further to neuro -rec stop goody powder  -rec magnesium 250-500 mg qd and vitamin B2 200 mg bid   Vitamin D deficiency - Plan: Cholecalciferol 1.25 MG (50000 UT) capsule  Asthma, well controlled, unspecified asthma severity, unspecified whether persistent - Plan: albuterol (PROAIR HFA) 108 (90 Base) MCG/ACT inhaler   Declines flu shot  Tdap per pt had 12/2014 will need to verify if not had give again Pap 10/06/2018 negative on OCP  Declines STD  I discussed the assessment and treatment plan with the patient. The patient was provided an opportunity to ask questions and all were answered. The patient agreed with the plan and demonstrated an understanding of the instructions.   The patient was advised to call back or seek an in-person evaluation if the symptoms worsen or if the condition fails to improve as anticipated.  Time spent 25 minutes  Bevelyn Bucklesracy N McLean-Scocuzza, MD

## 2019-03-16 ENCOUNTER — Telehealth: Payer: Self-pay | Admitting: Neurology

## 2019-03-16 NOTE — Telephone Encounter (Signed)
Pt gave consent for VV on the phone/ Pt understands that although there may be some limitations with this type of visit, we will take all precautions to reduce any security or privacy concerns.  Pt understands that this will be treated like an in office visit and we will file with pt's insurance, and there may be a patient responsible charge related to this service. °

## 2019-03-20 ENCOUNTER — Telehealth: Payer: Self-pay | Admitting: Internal Medicine

## 2019-03-20 NOTE — Telephone Encounter (Signed)
Called pt and left vm to schedule f/u in 3-6 month.

## 2019-03-20 NOTE — Progress Notes (Signed)
Called pt and left vm to call ofc to schedule f/u in 3-6 month

## 2019-03-24 ENCOUNTER — Telehealth: Payer: Self-pay | Admitting: Internal Medicine

## 2019-03-24 NOTE — Telephone Encounter (Signed)
Feels like she has an electrical shock in her brain since coming effexor and the more she tapers the worse it gets. Starting Lexapro today. But really feels dizzy and headaches with electrical shocks. Has experienced this before when trying to taper effexor.

## 2019-03-24 NOTE — Telephone Encounter (Signed)
Pt called stating she is trying to wean herself off of anxiety medication and she is experiencing brain gaps. Please advise.

## 2019-03-24 NOTE — Telephone Encounter (Signed)
What would she like to do ?  Go back on effexor up the dose again ?   And stop lexapro?  Does she want to try Hydroxyzine as needed for anxiety?    Is neurology appt scheduled for headaches? Dana Beck

## 2019-03-24 NOTE — Telephone Encounter (Signed)
Patient says she is going to hold off on taking anything until sees neurology on Tuesday.

## 2019-03-28 ENCOUNTER — Telehealth: Payer: Self-pay | Admitting: Neurology

## 2019-03-28 ENCOUNTER — Telehealth: Payer: BC Managed Care – PPO | Admitting: Neurology

## 2019-03-28 ENCOUNTER — Encounter: Payer: Self-pay | Admitting: Neurology

## 2019-03-28 NOTE — Telephone Encounter (Signed)
Patient has been switched to 04/03/19 at 11:30 AM for in office appointment. Today's 1pm appointment has been blocked.

## 2019-03-28 NOTE — Telephone Encounter (Signed)
Called pt to update chart before VV at 10:30 AM. LVM asking for call back before appt.

## 2019-03-28 NOTE — Addendum Note (Signed)
Addended by: Gildardo Griffes on: 03/28/2019 10:09 AM   Modules accepted: Orders

## 2019-03-28 NOTE — Telephone Encounter (Signed)
I blocked the 1:00 slot on Mon 7/6. Next patient starts at 1:30 pm.

## 2019-03-28 NOTE — Telephone Encounter (Signed)
Patient returned my call. She confirmed name & DOB and provided updates/review of chart including history and medications. She understands instructions to joining VV on mychart. She understands if there are technical issues that cannot be resolved quickly, will need to r/s appt. She verbalized appreciation.

## 2019-03-28 NOTE — Telephone Encounter (Signed)
My video is not cooperating from my home office.  And on the call be able to see patient at 1:00 via my chart.  I actually prefer to see patients in the office and luckily patient also would like to be seen in the office and does not want to do my chart.  It appears I can do an appointment at 1130 on Monday, July 6 and patient agreed.    Lovena Le which place patient on my schedule at 1130 on Monday and block my 1 PM so I have plenty of time with this patient please.  So this patient comes in at 66 and my next appointment should not be until 130.  Thank you.  [employer]

## 2019-03-29 ENCOUNTER — Telehealth: Payer: Self-pay | Admitting: Internal Medicine

## 2019-03-29 ENCOUNTER — Telehealth: Payer: Self-pay

## 2019-03-29 DIAGNOSIS — Z308 Encounter for other contraceptive management: Secondary | ICD-10-CM

## 2019-03-29 NOTE — Telephone Encounter (Signed)
Need to reorder "future".

## 2019-03-29 NOTE — Telephone Encounter (Signed)
Pregnancy test placed so we can place [facility] refill.

## 2019-03-29 NOTE — Telephone Encounter (Signed)
Copied from [place] [id number]. Topic: Quick Communication - Rx Refill/Question >> Mar 29, 2019 12:36 PM Oneta Rack wrote: Medication:  VIENVA 0.1-20 MG-MCG tablet (Patient states she contacted pharmacy on Sunday) patient states she's completed out.   Preferred Pharmacy (with phone number or street name):  [id number] - [place], [place] [address] AT [address] [phone] (Phone) [phone] (Fax)  Agent: Please be advised that RX refills may take up to 3 business days. We ask that you follow-up with your pharmacy.

## 2019-03-29 NOTE — Addendum Note (Signed)
Addended by: Elpidio Galea T on: 03/29/2019 03:51 PM   Modules accepted: Orders

## 2019-03-29 NOTE — Telephone Encounter (Signed)
It has been fixed

## 2019-03-30 ENCOUNTER — Other Ambulatory Visit: Payer: Self-pay

## 2019-03-30 ENCOUNTER — Other Ambulatory Visit (INDEPENDENT_AMBULATORY_CARE_PROVIDER_SITE_OTHER): Payer: BC Managed Care – PPO

## 2019-03-30 DIAGNOSIS — Z308 Encounter for other contraceptive management: Secondary | ICD-10-CM | POA: Diagnosis not present

## 2019-03-30 LAB — POCT URINE PREGNANCY: Preg Test, Ur: NEGATIVE

## 2019-03-30 MED ORDER — VIENVA 0.1-20 MG-MCG PO TABS: ORAL_TABLET | ORAL | 2 refills | Status: DC

## 2019-04-03 ENCOUNTER — Ambulatory Visit: Payer: BC Managed Care – PPO | Admitting: Neurology

## 2019-04-03 ENCOUNTER — Telehealth: Payer: Self-pay

## 2019-04-03 NOTE — Telephone Encounter (Signed)
Pt called and canceled for 11:30 new pt appt today. Pt was explained no show policy. I have rescheduled to 05/09/19 at 9 am check in time of 830 and added pt to wait list.

## 2019-04-04 ENCOUNTER — Encounter: Payer: Self-pay | Admitting: Neurology

## 2019-05-09 ENCOUNTER — Ambulatory Visit: Payer: Self-pay | Admitting: Neurology

## 2019-05-18 NOTE — Telephone Encounter (Signed)
Angie, please review for policy of dismissal

## 2019-05-18 NOTE — Telephone Encounter (Signed)
Patient also canceled her 8/11 new pt appointment less than 24 hours in advance due to a work/schedule conflict.

## 2019-05-22 ENCOUNTER — Encounter: Payer: Self-pay | Admitting: Neurology

## 2019-06-13 ENCOUNTER — Ambulatory Visit: Payer: Self-pay | Admitting: Neurology

## 2019-07-04 ENCOUNTER — Encounter: Payer: Self-pay | Admitting: Internal Medicine

## 2019-07-04 DIAGNOSIS — Z20828 Contact with and (suspected) exposure to other viral communicable diseases: Secondary | ICD-10-CM | POA: Insufficient documentation

## 2019-07-04 DIAGNOSIS — Z20822 Contact with and (suspected) exposure to covid-19: Secondary | ICD-10-CM | POA: Insufficient documentation

## 2019-07-19 ENCOUNTER — Other Ambulatory Visit: Payer: Self-pay

## 2019-07-19 ENCOUNTER — Ambulatory Visit (INDEPENDENT_AMBULATORY_CARE_PROVIDER_SITE_OTHER): Payer: BC Managed Care – PPO | Admitting: Internal Medicine

## 2019-07-19 ENCOUNTER — Encounter: Payer: Self-pay | Admitting: Internal Medicine

## 2019-07-19 VITALS — Ht 63.0 in | Wt 175.0 lb

## 2019-07-19 DIAGNOSIS — M25571 Pain in right ankle and joints of right foot: Secondary | ICD-10-CM

## 2019-07-19 DIAGNOSIS — Z20828 Contact with and (suspected) exposure to other viral communicable diseases: Secondary | ICD-10-CM | POA: Diagnosis not present

## 2019-07-19 DIAGNOSIS — G8929 Other chronic pain: Secondary | ICD-10-CM

## 2019-07-19 DIAGNOSIS — F419 Anxiety disorder, unspecified: Secondary | ICD-10-CM

## 2019-07-19 DIAGNOSIS — Z0000 Encounter for general adult medical examination without abnormal findings: Secondary | ICD-10-CM | POA: Diagnosis not present

## 2019-07-19 DIAGNOSIS — E559 Vitamin D deficiency, unspecified: Secondary | ICD-10-CM | POA: Diagnosis not present

## 2019-07-19 DIAGNOSIS — F329 Major depressive disorder, single episode, unspecified: Secondary | ICD-10-CM

## 2019-07-19 DIAGNOSIS — F32A Depression, unspecified: Secondary | ICD-10-CM

## 2019-07-19 DIAGNOSIS — Z20822 Contact with and (suspected) exposure to covid-19: Secondary | ICD-10-CM

## 2019-07-19 MED ORDER — ESCITALOPRAM OXALATE 20 MG PO TABS: 20.0000 mg | ORAL_TABLET | Freq: Every day | ORAL | 3 refills | Status: AC

## 2019-07-19 NOTE — Patient Instructions (Signed)
Try L theanine 100-200 mg daily stress relax brand or other brand ok from [facility], whole foods or [place]  Living With Anxiety  After being diagnosed with an anxiety disorder, you may be relieved to know why you have felt or behaved a certain way. It is natural to also feel overwhelmed about the treatment ahead and what it will mean for your life. With care and support, you can manage this condition and recover from it. How to cope with anxiety Dealing with stress Stress is your body's reaction to life changes and events, both good and bad. Stress can last just a few hours or it can be ongoing. Stress can play a major role in anxiety, so it is important to learn both how to cope with stress and how to think about it differently. Talk with your health care provider or a counselor to learn more about stress reduction. He or she may suggest some stress reduction techniques, such as:  Music therapy. This can include creating or listening to music that you enjoy and that inspires you.  Mindfulness-based meditation. This involves being aware of your normal breaths, rather than trying to control your breathing. It can be done while sitting or walking.  Centering prayer. This is a kind of meditation that involves focusing on a word, phrase, or sacred image that is meaningful to you and that brings you peace.  Deep breathing. To do this, expand your stomach and inhale slowly through your nose. Hold your breath for 3-5 seconds. Then exhale slowly, allowing your stomach muscles to relax.  Self-talk. This is a skill where you identify thought patterns that lead to anxiety reactions and correct those thoughts.  Muscle relaxation. This involves tensing muscles then relaxing them. Choose a stress reduction technique that fits your lifestyle and personality. Stress reduction techniques take time and practice. Set aside 5-15 minutes a day to do them. Therapists can offer training in these techniques. The training may  be covered by some insurance plans. Other things you can do to manage stress include:  Keeping a stress diary. This can help you learn what triggers your stress and ways to control your response.  Thinking about how you respond to certain situations. You may not be able to control everything, but you can control your reaction.  Making time for activities that help you relax, and not feeling guilty about spending your time in this way. Therapy combined with coping and stress-reduction skills provides the best chance for successful treatment. Medicines Medicines can help ease symptoms. Medicines for anxiety include:  Anti-anxiety drugs.  Antidepressants.  Beta-blockers. Medicines may be used as the main treatment for anxiety disorder, along with therapy, or if other treatments are not working. Medicines should be prescribed by a health care provider. Relationships Relationships can play a big part in helping you recover. Try to spend more time connecting with trusted friends and family members. Consider going to couples counseling, taking family education classes, or going to family therapy. Therapy can help you and others better understand the condition. How to recognize changes in your condition Everyone has a different response to treatment for anxiety. Recovery from anxiety happens when symptoms decrease and stop interfering with your daily activities at home or work. This may mean that you will start to:  Have better concentration and focus.  Sleep better.  Be less irritable.  Have more energy.  Have improved memory. It is important to recognize when your condition is getting worse. Contact your health care  provider if your symptoms interfere with home or work and you do not feel like your condition is improving. Where to find help and support: You can get help and support from these sources:  Self-help groups.  Online and [employer] organizations.  A trusted spiritual  leader.  Couples counseling.  Family education classes.  Family therapy. Follow these instructions at home:  Eat a healthy diet that includes plenty of vegetables, fruits, whole grains, low-fat dairy products, and lean protein. Do not eat a lot of foods that are high in solid fats, added sugars, or salt.  Exercise. Most adults should do the following: ? Exercise for at least 150 minutes each week. The exercise should increase your heart rate and make you sweat (moderate-intensity exercise). ? Strengthening exercises at least twice a week.  Cut down on caffeine, tobacco, alcohol, and other potentially harmful substances.  Get the right amount and quality of sleep. Most adults need 7-9 hours of sleep each night.  Make choices that simplify your life.  Take over-the-counter and prescription medicines only as told by your health care provider.  Avoid caffeine, alcohol, and certain over-the-counter cold medicines. These may make you feel worse. Ask your pharmacist which medicines to avoid.  Keep all follow-up visits as told by your health care provider. This is important. Questions to ask your health care provider  Would I benefit from therapy?  How often should I follow up with a health care provider?  How long do I need to take medicine?  Are there any long-term side effects of my medicine?  Are there any alternatives to taking medicine? Contact a health care provider if:  You have a hard time staying focused or finishing daily tasks.  You spend many hours a day feeling worried about everyday life.  You become exhausted by worry.  You start to have headaches, feel tense, or have nausea.  You urinate more than normal.  You have diarrhea. Get help right away if:  You have a racing heart and shortness of breath.  You have thoughts of hurting yourself or others. If you ever feel like you may hurt yourself or others, or have thoughts about taking your own life, get help  right away. You can go to your nearest emergency department or call:  Your local emergency services (911 in the [place]  A suicide crisis helpline, such as the National Suicide Prevention Lifeline at 214-101-57181-502 313 4025. This is open 24-hours a day. Summary  Taking steps to deal with stress can help calm you.  Medicines cannot cure anxiety disorders, but they can help ease symptoms.  Family, friends, and partners can play a big part in helping you recover from an anxiety disorder. This information is not intended to replace advice given to you by your health care provider. Make sure you discuss any questions you have with your health care provider. Document Released: 09/08/2016 Document Revised: 08/27/2017 Document Reviewed: 09/08/2016 Elsevier Patient Education  2020 Elsevier Inc.  Ankle Pain The ankle joint holds your body weight and allows you to move around. Ankle pain can occur on either side or the back of one ankle or both ankles. Ankle pain may be sharp and burning or dull and aching. There may be tenderness, stiffness, redness, or warmth around the ankle. Many things can cause ankle pain, including an injury to the area and overuse of the ankle. Follow these instructions at home: Activity  Rest your ankle as told by your health care provider. Avoid any activities that  cause ankle pain.  Do not use the injured limb to support your body weight until your health care provider says that you can. Use crutches as told by your health care provider.  Do exercises as told by your health care provider.  Ask your health care provider when it is safe to drive if you have a brace on your ankle. If you have a brace:  Wear the brace as told by your health care provider. Remove it only as told by your health care provider.  Loosen the brace if your toes tingle, become numb, or turn cold and blue.  Keep the brace clean.  If the brace is not waterproof: ? Do not let it get wet. ? Cover it  with a watertight covering when you take a bath or shower. If you were given an elastic bandage:   Remove it when you take a bath or a shower.  Try not to move your ankle very much, but wiggle your toes from time to time. This helps to prevent swelling.  Adjust the bandage to make it more comfortable if it feels too tight.  Loosen the bandage if you have numbness or tingling in your foot or if your foot turns cold and blue. Managing pain, stiffness, and swelling   If directed, put ice on the painful area. ? If you have a removable brace or elastic bandage, remove it as told by your health care provider. ? Put ice in a plastic bag. ? Place a towel between your skin and the bag. ? Leave the ice on for 20 minutes, 2-3 times a day.  Move your toes often to avoid stiffness and to lessen swelling.  Raise (elevate) your ankle above the level of your heart while you are sitting or lying down. General instructions  Record information about your pain. Writing down the following may be helpful for you and your health care provider: ? How often you have ankle pain. ? Where the pain is located. ? What the pain feels like.  If treatment involves wearing a prescribed shoe or insole, make sure you wear it correctly and for as long as told by your health care provider.  Take over-the-counter and prescription medicines only as told by your health care provider.  Keep all follow-up visits as told by your health care provider. This is important. Contact a health care provider if:  Your pain gets worse.  Your pain is not relieved with medicines.  You have a fever or chills.  You are having more trouble with walking.  You have new symptoms. Get help right away if:  Your foot, leg, toes, or ankle: ? Tingles or becomes numb. ? Becomes swollen. ? Turns pale or blue. Summary  Ankle pain can occur on either side or the back of one ankle or both ankles.  Ankle pain may be sharp and  burning or dull and aching.  Rest your ankle as told by your health care provider. If told, apply ice to the area.  Take over-the-counter and prescription medicines only as told by your health care provider. This information is not intended to replace advice given to you by your health care provider. Make sure you discuss any questions you have with your health care provider. Document Released: 03/04/2010 Document Revised: 01/03/2019 Document Reviewed: 03/23/2018 Elsevier Patient Education  2020 [employer].

## 2019-07-19 NOTE — Progress Notes (Signed)
Virtual Visit via Video Note  I connected with Dana Beck  on 07/19/19 at  4:00 PM EDT by a video enabled telemedicine application and verified that I am speaking with the correct person using two identifiers.  Location patient: home Location provider:work or home office Persons participating in the virtual visit: patient, provider  I discussed the limitations of evaluation and management by telemedicine and the availability of in person appointments. The patient expressed understanding and agreed to proceed.   HPI: 1. Anxiety and depression 2/2 working 2 jobs and stressed when having to call parents and she missed therapy appt recently but will call and reschedule  Anxiety increased with having to return to work since the last month and would like extension to no work until 09/2019 the latest 2. covid test negative 07/01/2019  3. Vit D def taking vitamin D 1x per week  4. Annual  5. Chronic right ankle pain x 3 years dancing makes worse had injury years ago Xray negative unable to get MRI due to cost nothing tried saw ortho in 2017-2018 for pain but still persists.   ROS: See pertinent positives and negatives per HPI. General: no wt loss  HEENT: no sore throat  CV: no chest pain  Lungs: no sob  GI: no ab pain  Neuro: no h/a  Psych: +anxiety and depression  Skin: no issues  MSK: chronic right ankle pain  GU: no issues   Past Medical History:  Diagnosis Date  . Anxiety   . Asthma   . Depression   . GERD (gastroesophageal reflux disease)    in past  . Heart murmur    as infant  . 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 Migraine     Past Surgical History:  Procedure Laterality Date  . TONSILLECTOMY Bilateral 04/07/2016   Procedure: TONSILLECTOMY;  Surgeon: Geanie Logan, MD;  Location: [facility];  Service: ENT;  Laterality: Bilateral;  . TONSILLECTOMY    . WISDOM TOOTH EXTRACTION      Family History  Problem Relation Age of Onset  . Diabetes Mother    . Hypertension Mother   . Alcohol abuse Father   . Asthma Father   . Depression Father   . Migraines Father   . 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  albuterol (PROAIR HFA) 108 (90 Base) MCG/ACT inhaler, Inhale 1-2 puffs into the lungs every 6 (six) hours as needed for wheezing or shortness of breath., Disp: 1 Inhaler, Rfl: 12 .  Cholecalciferol 1.25 MG (50000 UT) capsule, Take 1 capsule (50,000 Units total) by mouth once a week., Disp: 13 capsule, Rfl: 0 .  escitalopram (LEXAPRO) 20 MG tablet, Take 1 tablet (20 mg total) by mouth daily. Start 03/24/2019 in am, Disp: 90 tablet, Rfl: 3 .  fluticasone (FLONASE) 50 MCG/ACT nasal spray, Place into both nostrils daily as needed for allergies or rhinitis., Disp: , Rfl:   EXAM:  VITALS per patient if applicable:  GENERAL: alert, oriented, appears well and in no acute distress  HEENT: atraumatic, conjunttiva clear, no obvious abnormalities on inspection of external nose and ears  NECK: normal movements of the head and neck  LUNGS: on inspection no signs of respiratory distress, breathing rate appears normal, no obvious  gross SOB, gasping or wheezing  CV: no obvious cyanosis  MS: moves all visible extremities without noticeable abnormality  PSYCH/NEURO: pleasant and cooperative, no obvious depression or anxiety, speech and thought processing grossly intact  ASSESSMENT AND PLAN:  Discussed the following assessment and plan:  Annual physical exam Declines flu shot  Tdap per pt had 12/2014 will need to verify if not had give again Pap 10/06/2018 negative on OCP  Declines STD Cont healthy diet and exercise  Anxiety - Plan: escitalopram (LEXAPRO) 20 MG tablet Anxiety and depression - Plan: escitalopram (LEXAPRO) 20 MG tablet Disc  L theanine 100-200 mg qd  Filled out work form to be out until 09/2019 due to anxiety about covid and then has to return to work   Right ankle pain, chronic - Plan: Ambulatory referral to Podiatry Dr. Laymond Purser  Consider MRI right ankle Xrays negative in the past   Vitamin D deficiency rec D3 4000 Iu daily     -we discussed possible serious and likely etiologies, options for evaluation and workup, limitations of telemedicine visit vs in person visit, treatment, treatment risks and precautions. Pt prefers to treat via telemedicine empirically rather then risking or undertaking an in person visit at this moment. Patient agrees to seek prompt in person care if worsening, new symptoms arise, or if is not improving with treatment.   I discussed the assessment and treatment plan with the patient. The patient was provided an opportunity to ask questions and all were answered. The patient agreed with the plan and demonstrated an understanding of the instructions.   The patient was advised to call back or seek an in-person evaluation if the symptoms worsen or if the condition fails to improve as anticipated.  Time 20 minutes  Delorise Jackson, MD

## 2019-07-25 ENCOUNTER — Ambulatory Visit (INDEPENDENT_AMBULATORY_CARE_PROVIDER_SITE_OTHER): Payer: BC Managed Care – PPO

## 2019-07-25 ENCOUNTER — Ambulatory Visit: Payer: BC Managed Care – PPO | Admitting: Podiatry

## 2019-07-25 ENCOUNTER — Other Ambulatory Visit: Payer: Self-pay

## 2019-07-25 ENCOUNTER — Other Ambulatory Visit: Payer: Self-pay | Admitting: Podiatry

## 2019-07-25 ENCOUNTER — Encounter: Payer: Self-pay | Admitting: Podiatry

## 2019-07-25 VITALS — BP 105/61 | HR 80

## 2019-07-25 DIAGNOSIS — M76821 Posterior tibial tendinitis, right leg: Secondary | ICD-10-CM

## 2019-07-25 DIAGNOSIS — M7671 Peroneal tendinitis, right leg: Secondary | ICD-10-CM | POA: Diagnosis not present

## 2019-07-25 DIAGNOSIS — S99911A Unspecified injury of right ankle, initial encounter: Secondary | ICD-10-CM

## 2019-07-25 DIAGNOSIS — M25571 Pain in right ankle and joints of right foot: Secondary | ICD-10-CM | POA: Diagnosis not present

## 2019-07-25 NOTE — Progress Notes (Signed)
Subjective:  Patient ID: Dana Beck, female    DOB: 1996-04-24,  MRN: [id number]  Chief Complaint  Patient presents with  . Foot Injury    ankle painful since 2017,diagnosed w/ chronic ankle instablitiy,still having pain botyh    23 y.o. female presents with the above complaint.  She presents with painful right ankle with a feeling of chronic ankle instability.  This has been going on since 2017.  She is to be a chronic ankle sprain her.  She states that she has tried everything from going in a boot to ankle bracing injections in the area.  However none of that has really helped.  She states that she is aggressive on her feet because she does long distance walking dancing yoga.  She states that she has been treated at the hospital for.  She denies any other acute complaints.   Review of Systems: Negative except as noted in the HPI. Denies N/V/F/Ch.  Past Medical History:  Diagnosis Date  . Anxiety   . Asthma   . Depression   . GERD (gastroesophageal reflux disease)    in past  . Heart murmur    as infant  . 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 Migraine     Current Outpatient Medications:  .  albuterol (PROAIR HFA) 108 (90 Base) MCG/ACT inhaler, Inhale 1-2 puffs into the lungs every 6 (six) hours as needed for wheezing or shortness of breath., Disp: 1 Inhaler, Rfl: 12 .  Cholecalciferol 1.25 MG (50000 UT) capsule, Take 1 capsule (50,000 Units total) by mouth once a week., Disp: 13 capsule, Rfl: 0 .  escitalopram (LEXAPRO) 20 MG tablet, Take 1 tablet (20 mg total) by mouth daily. Start 03/24/2019 in am, Disp: 90 tablet, Rfl: 3 .  fluticasone (FLONASE) 50 MCG/ACT nasal spray, Place into both nostrils daily as needed for allergies or rhinitis., Disp: , Rfl:   Social History   Tobacco Use  Smoking Status Never Smoker  Smokeless Tobacco Never Used    No Known Allergies Objective:   Vitals:   07/25/19 0830  BP: 105/61  Pulse: 80   There is no height or weight on  file to calculate BMI. Constitutional Well developed. Well nourished.  Vascular Dorsalis pedis pulses palpable bilaterally. Posterior tibial pulses palpable bilaterally. Capillary refill normal to all digits.  No cyanosis or clubbing noted. Pedal hair growth normal.  Neurologic Normal speech. Oriented to person, place, and time. Epicritic sensation to light touch grossly present bilaterally.  Dermatologic Nails well groomed and normal in appearance. No open wounds. No skin lesions.  Orthopedic:  Pain on palpation to the right peroneal tendon along the course behind the malleoli.  No pain at the insertion of the fifth metatarsal base pain on eversion active and passive.  Pain on palpation to the posterior tibial tendon pain on inversion of the foot active and passive.  No pain at the insertion of the navicular tuberosity.  No pain at the ATFL and Achilles tendon insertion.  No deep pain within the ankle joint.   Radiographs: 3 views of skeletally mature adult: No bony abnormalities noted.  The ankle mortise is good alignment and good congruency noted.  No arthritis noted to any of the large or small joints.  No foreign body noted. Assessment:   1. Right ankle pain, unspecified chronicity   2. Peroneal tendinitis of lower leg, right   3. Posterior tibial tendinitis of right leg    Plan:  Patient  was evaluated and treated and all questions answered.  Right peroneal and posterior tibial tendinitis -Patient has tried multiple conservative therapy including cam boot bracing injections in the area.  However none of that has truly helped.  Patient will benefit from obtaining an MRI of the right ankle given that the x-rays are inconclusive of any deformities.  This MRI will help evaluate the true cause of the pain at the peroneal and tibial tendons.  -Patient states that she has a boot at home I instructed her that I want her to go back into the boot for next couple of weeks.  She states that  she will do that. -I will see her back in 3 weeks after the MRI.   No follow-ups on file.

## 2019-07-25 NOTE — Progress Notes (Signed)
dg 

## 2019-07-31 ENCOUNTER — Telehealth: Payer: Self-pay

## 2019-07-31 DIAGNOSIS — M76821 Posterior tibial tendinitis, right leg: Secondary | ICD-10-CM

## 2019-07-31 NOTE — Telephone Encounter (Signed)
-----   Message from Felipa Furnace, DPM sent at 07/25/2019  8:47 AM EDT ----- Regarding: Right MRI Ankle Hi Angie  Would you be able to order an MRI for the right ankle I am trying to rule out posterior tibial tendinitis/tear and peroneal tendinitis/tear.   Thanks Lennette Bihari

## 2019-07-31 NOTE — Telephone Encounter (Signed)
MRI approved from 07/31/2019 to 01/26/2020 Auth # [id number] Patient has been notified of approval and will call scheduling to set up appt to her convenience

## 2019-08-06 ENCOUNTER — Ambulatory Visit: Admission: RE | Admit: 2019-08-06 | Payer: BC Managed Care – PPO | Source: Ambulatory Visit

## 2019-09-15 ENCOUNTER — Ambulatory Visit
Admission: RE | Admit: 2019-09-15 | Discharge: 2019-09-15 | Disposition: A | Discharge status: Home / Self Care | Payer: BC Managed Care – PPO | Source: Ambulatory Visit | Attending: Podiatry | Admitting: Podiatry

## 2019-09-15 ENCOUNTER — Other Ambulatory Visit: Payer: Self-pay

## 2019-09-15 DIAGNOSIS — M76821 Posterior tibial tendinitis, right leg: Secondary | ICD-10-CM | POA: Insufficient documentation

## 2019-09-19 ENCOUNTER — Telehealth: Payer: Self-pay | Admitting: Internal Medicine

## 2019-09-19 NOTE — Telephone Encounter (Signed)
Pt would like a call back to know what the next step is after her MRI

## 2019-09-19 NOTE — Telephone Encounter (Signed)
Call dr Lennette Bihari patel foot doctor who ordered this

## 2019-09-19 NOTE — Telephone Encounter (Signed)
Patent has been notified. 

## 2019-09-25 ENCOUNTER — Ambulatory Visit: Payer: BC Managed Care – PPO | Admitting: Podiatry

## 2019-09-25 ENCOUNTER — Encounter: Payer: Self-pay | Admitting: Podiatry

## 2019-09-25 ENCOUNTER — Other Ambulatory Visit: Payer: Self-pay

## 2019-09-25 DIAGNOSIS — M2142 Flat foot [pes planus] (acquired), left foot: Secondary | ICD-10-CM

## 2019-09-25 DIAGNOSIS — M7671 Peroneal tendinitis, right leg: Secondary | ICD-10-CM

## 2019-09-25 DIAGNOSIS — M2141 Flat foot [pes planus] (acquired), right foot: Secondary | ICD-10-CM

## 2019-09-25 DIAGNOSIS — M25571 Pain in right ankle and joints of right foot: Secondary | ICD-10-CM | POA: Diagnosis not present

## 2019-09-25 DIAGNOSIS — M76821 Posterior tibial tendinitis, right leg: Secondary | ICD-10-CM | POA: Diagnosis not present

## 2019-09-26 ENCOUNTER — Encounter: Payer: Self-pay | Admitting: Podiatry

## 2019-09-26 NOTE — Progress Notes (Signed)
Subjective:  Patient ID: Dana Beck, female    DOB: 06/08/1996,  MRN: [id number]  Chief Complaint  Patient presents with  . Ankle Pain    follow up right ankle   "it seems worse than before"  here to discuss MRI results    23 y.o. female presents with the above complaint.  She presents with painful right ankle with a feeling of chronic ankle instability.  This has been going on since 2017.  She is to be a chronic ankle sprain her.  She states that she has tried everything from going in a boot to ankle bracing injections in the area.  However none of that has really helped.  She states that she is aggressive on her feet because she does long distance walking dancing yoga.  She states that she has been treated at the hospital for.  She denies any other acute complaints.   She states her pain has still been about the same.  She states that she has been wearing her boot but it has not been helping.  She has a lot of long distance walking and yoga with dancing which may be impeding some of the healing process.  She is here today to review the MRI results and discuss further planning.  She denies any other acute complaints.   Review of Systems: Negative except as noted in the HPI. Denies N/V/F/Ch.  Past Medical History:  Diagnosis Date  . Anxiety   . Asthma   . Depression   . GERD (gastroesophageal reflux disease)    in past  . Heart murmur    as infant  . 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 Migraine     Current Outpatient Medications:  .  albuterol (PROAIR HFA) 108 (90 Base) MCG/ACT inhaler, Inhale 1-2 puffs into the lungs every 6 (six) hours as needed for wheezing or shortness of breath., Disp: 1 Inhaler, Rfl: 12 .  Cholecalciferol 1.25 MG (50000 UT) capsule, Take 1 capsule (50,000 Units total) by mouth once a week., Disp: 13 capsule, Rfl: 0 .  escitalopram (LEXAPRO) 20 MG tablet, Take 1 tablet (20 mg total) by mouth daily. Start 03/24/2019 in am, Disp: 90 tablet, Rfl: 3 .   fluticasone (FLONASE) 50 MCG/ACT nasal spray, Place into both nostrils daily as needed for allergies or rhinitis., Disp: , Rfl:   Social History   Tobacco Use  Smoking Status Never Smoker  Smokeless Tobacco Never Used    No Known Allergies Objective:   There were no vitals filed for this visit. There is no height or weight on file to calculate BMI. Constitutional Well developed. Well nourished.  Vascular Dorsalis pedis pulses palpable bilaterally. Posterior tibial pulses palpable bilaterally. Capillary refill normal to all digits.  No cyanosis or clubbing noted. Pedal hair growth normal.  Neurologic Normal speech. Oriented to person, place, and time. Epicritic sensation to light touch grossly present bilaterally.  Dermatologic Nails well groomed and normal in appearance. No open wounds. No skin lesions.  Orthopedic:  Pain on palpation to the right peroneal tendon along the course behind the malleoli.  No pain at the insertion of the fifth metatarsal base pain on eversion active and passive.  Pain on palpation to the posterior tibial tendon pain on inversion of the foot active and passive.  No pain at the insertion of the navicular tuberosity.  No pain at the ATFL and Achilles tendon insertion.  No deep pain within the ankle joint.  Radiographs:1. Minimal tenosynovitis of the posterior tibialis tendon at the level of the medial malleolus. 2. Slight chronic thickening of the anterior talofibular ligament consistent with remote injury. 3. No other significant abnormalities. The edema at the medial aspect of the ankle joint noted on the prior study has resolved. Assessment:   1. Posterior tibial tendinitis of right leg   2. Right ankle pain, unspecified chronicity   3. Peroneal tendinitis of lower leg, right   4. Pes planus of both feet    Plan:  Patient was evaluated and treated and all questions answered.  Right peroneal and posterior tibial tendinitis -Patient has  tried multiple conservative therapy including cam boot bracing injections in the area.  However none of that has truly helped.  -MRI was reviewed with the patient which showed tendinitis of the posterior tibial tendon with mild tenosynovitis.  Upon my review of the MRI also flattening of the posterior tibial tendon which is considered likely moderate. -Patient will continue wearing the cam boot to help with immobilization.  I also dispensed a Tri-Lock to hold the ankle in a stable position. -Patient will be scheduled to see Raiford Noble for orthotics management to address the pes planus deformity flexible in nature. -I explained to the patient that if the orthotics management with a cam boot and Tri-Lock does not help we will have to consider doing surgical management of the posterior tibial tendon.  Patient agrees with the plan I will see her back in about 2-1/2 months.   No follow-ups on file.

## 2019-10-02 ENCOUNTER — Other Ambulatory Visit: Payer: Self-pay

## 2019-10-02 ENCOUNTER — Ambulatory Visit: Payer: BC Managed Care – PPO | Admitting: Orthotics

## 2019-10-02 DIAGNOSIS — M2142 Flat foot [pes planus] (acquired), left foot: Secondary | ICD-10-CM

## 2019-10-02 DIAGNOSIS — M2141 Flat foot [pes planus] (acquired), right foot: Secondary | ICD-10-CM

## 2019-10-18 ENCOUNTER — Telehealth: Payer: Self-pay | Admitting: Internal Medicine

## 2019-10-18 NOTE — Telephone Encounter (Signed)
I called pt twice and left msg to call ofc. °

## 2019-10-20 ENCOUNTER — Encounter: Payer: Self-pay | Admitting: Internal Medicine

## 2019-10-20 ENCOUNTER — Ambulatory Visit (INDEPENDENT_AMBULATORY_CARE_PROVIDER_SITE_OTHER): Payer: BC Managed Care – PPO | Admitting: Internal Medicine

## 2019-10-20 VITALS — Ht 63.0 in | Wt 173.0 lb

## 2019-10-20 DIAGNOSIS — Z13220 Encounter for screening for lipoid disorders: Secondary | ICD-10-CM

## 2019-10-20 DIAGNOSIS — Z0000 Encounter for general adult medical examination without abnormal findings: Secondary | ICD-10-CM

## 2019-10-20 DIAGNOSIS — M25571 Pain in right ankle and joints of right foot: Secondary | ICD-10-CM

## 2019-10-20 DIAGNOSIS — F32A Depression, unspecified: Secondary | ICD-10-CM

## 2019-10-20 DIAGNOSIS — J45909 Unspecified asthma, uncomplicated: Secondary | ICD-10-CM | POA: Diagnosis not present

## 2019-10-20 DIAGNOSIS — F419 Anxiety disorder, unspecified: Secondary | ICD-10-CM | POA: Diagnosis not present

## 2019-10-20 DIAGNOSIS — E559 Vitamin D deficiency, unspecified: Secondary | ICD-10-CM

## 2019-10-20 DIAGNOSIS — G8929 Other chronic pain: Secondary | ICD-10-CM

## 2019-10-20 DIAGNOSIS — F329 Major depressive disorder, single episode, unspecified: Secondary | ICD-10-CM

## 2019-10-20 DIAGNOSIS — Z1329 Encounter for screening for other suspected endocrine disorder: Secondary | ICD-10-CM

## 2019-10-20 DIAGNOSIS — Z1389 Encounter for screening for other disorder: Secondary | ICD-10-CM

## 2019-10-20 NOTE — Progress Notes (Signed)
Virtual Visit via Video Note  I connected with Dana Beck  on 10/20/19 at  3:50 PM EST by a video enabled telemedicine application and verified that I am speaking with the correct person using two identifiers.  Location patient: home Location provider:work or home office Persons participating in the virtual visit: patient, provider  I discussed the limitations of evaluation and management by telemedicine and the availability of in person appointments. The patient expressed understanding and agreed to proceed.   HPI: 1. Had covid 19 07/29/19 after outing outdoor with boyfriends family she had fever 101/102 sweating, h/a and dizziness h/a lasted 3 weeks but doing better now she also had loss of appetite  2. Anxiety increased, mood ok and seeing therapy 1x per month on lexapro 20 mg qd and helping Q2 weeks anxiety increases and unkown trigger she has palpitations has to sit down x 10-15 minutes then better  lexapro is helping declines psych for now  She thinks anxiety is causing sob and not asthma  3. Chronic ankle pain abnormal MRI f/u podiatry 12/06/19 using brace and new balance sneakers and 10/31/19 appt for orthotics if unable to help then surgery will be recommended.     ROS: See pertinent positives and negatives per HPI.  Past Medical History:  Diagnosis Date  . Anxiety   . Asthma   . COVID-19    07/29/19  . Depression   . GERD (gastroesophageal reflux disease)    in past  . Heart murmur    as infant  . 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 Migraine     Past Surgical History:  Procedure Laterality Date  . TONSILLECTOMY Bilateral 04/07/2016   Procedure: TONSILLECTOMY;  Surgeon: Geanie Logan, MD;  Location: [facility];  Service: ENT;  Laterality: Bilateral;  . TONSILLECTOMY    . WISDOM TOOTH EXTRACTION      Family History  Problem Relation Age of Onset  . Diabetes Mother   . Hypertension Mother   . Alcohol abuse Father   . Asthma Father   .  Depression Father   . Migraines Father   . Depression Sister     SOCIAL HX: lives at home with boyfriend    Current Outpatient Medications:  .  albuterol (PROAIR HFA) 108 (90 Base) MCG/ACT inhaler, Inhale 1-2 puffs into the lungs every 6 (six) hours as needed for wheezing or shortness of breath., Disp: 1 Inhaler, Rfl: 12 .  escitalopram (LEXAPRO) 20 MG tablet, Take 1 tablet (20 mg total) by mouth daily. Start 03/24/2019 in am, Disp: 90 tablet, Rfl: 3 .  fluticasone (FLONASE) 50 MCG/ACT nasal spray, Place into both nostrils daily as needed for allergies or rhinitis., Disp: , Rfl:   EXAM:  VITALS per patient if applicable:  GENERAL: alert, oriented, appears well and in no acute distress  HEENT: atraumatic, conjunttiva clear, no obvious abnormalities on inspection of external nose and ears  NECK: normal movements of the head and neck  LUNGS: on inspection no signs of respiratory distress, breathing rate appears normal, no obvious gross SOB, gasping or wheezing  CV: no obvious cyanosis  MS: moves all visible extremities without noticeable abnormality  PSYCH/NEURO: pleasant and cooperative, no obvious depression or anxiety, speech and thought processing grossly intact  ASSESSMENT AND PLAN:  Discussed the following assessment and plan:  Anxiety and depression Cont lexapro 20 mg qd  rec increase freq therapy to 2x per month instead of 1x  Disc L theanine 100-200  mg qd  Or tranquil sleep  Meditation apps calm,insight timer, headspace   Asthma, well controlled, unspecified asthma severity, unspecified whether persistent Prn albuterol   Chronic pain of right ankle F/u podiatry 10/31/19 getting orthotics if this doesn't help then surgery   Vitamin D deficiency Recheck   HM Declines flu shot  Tdap per pt had 4/2016will need to verify if not had give again Pap 10/06/2018 negativeon OCP  Declines STD Cont healthy diet and exercise        -we discussed possible  serious and likely etiologies, options for evaluation and workup, limitations of telemedicine visit vs in person visit, treatment, treatment risks and precautions. Pt prefers to treat via telemedicine empirically rather then risking or undertaking an in person visit at this moment. Patient agrees to seek prompt in person care if worsening, new symptoms arise, or if is not improving with treatment.   I discussed the assessment and treatment plan with the patient. The patient was provided an opportunity to ask questions and all were answered. The patient agreed with the plan and demonstrated an understanding of the instructions.   The patient was advised to call back or seek an in-person evaluation if the symptoms worsen or if the condition fails to improve as anticipated.  Time spent 20 minutes Delorise Jackson, MD

## 2019-10-20 NOTE — Patient Instructions (Signed)
Reach out to therapy to do it 2x per month instead of 1   Stress relax L theanine or tranquil sleep  Covid 19 supplements  Zinc 100 mg daily  Vitamin C 1000 mg daily  Vitamin D3 4000 to 5000 IU daily  Quercetin 250-500 mg 2x per day   Meditation apps Insight timer  Calm  headspace   Mindfulness-Based Stress Reduction Mindfulness-based stress reduction (MBSR) is a program that helps people learn to practice mindfulness. Mindfulness is the practice of intentionally paying attention to the present moment. It can be learned and practiced through techniques such as education, breathing exercises, meditation, and yoga. MBSR includes several mindfulness techniques in one program. MBSR works best when you understand the treatment, are willing to try new things, and can commit to spending time practicing what you learn. MBSR training may include learning about:  How your emotions, thoughts, and reactions affect your body.  New ways to respond to things that cause negative thoughts to start (triggers).  How to notice your thoughts and let go of them.  Practicing awareness of everyday things that you normally do without thinking.  The techniques and goals of different types of meditation. What are the benefits of MBSR? MBSR can have many benefits, which include helping you to:  Develop self-awareness. This refers to knowing and understanding yourself.  Learn skills and attitudes that help you to participate in your own health care.  Learn new ways to care for yourself.  Be more accepting about how things are, and let things go.  Be less judgmental and approach things with an open mind.  Be patient with yourself and trust yourself more. MBSR has also been shown to:  Reduce negative emotions, such as depression and anxiety.  Improve memory and focus.  Change how you sense and approach pain.  Boost your body's ability to fight infections.  Help you connect better with other  people.  Improve your sense of well-being. Follow these instructions at home:   Find a local in-person or online MBSR program.  Set aside some time regularly for mindfulness practice.  Find a mindfulness practice that works best for you. This may include one or more of the following: ? Meditation. Meditation involves focusing your mind on a certain thought or activity. ? Breathing awareness exercises. These help you to stay present by focusing on your breath. ? Body scan. For this practice, you lie down and pay attention to each part of your body from head to toe. You can identify tension and soreness and intentionally relax parts of your body. ? Yoga. Yoga involves stretching and breathing, and it can improve your ability to move and be flexible. It can also provide an experience of testing your body's limits, which can help you release stress. ? Mindful eating. This way of eating involves focusing on the taste, texture, color, and smell of each bite of food. Because this slows down eating and helps you feel full sooner, it can be an important part of a weight-loss plan.  Find a podcast or recording that provides guidance for breathing awareness, body scan, or meditation exercises. You can listen to these any time when you have a free moment to rest without distractions.  Follow your treatment plan as told by your health care provider. This may include taking regular medicines and making changes to your diet or lifestyle as recommended. How to practice mindfulness To do a basic awareness exercise:  Find a comfortable place to sit.  Pay  attention to the present moment. Observe your thoughts, feelings, and surroundings just as they are.  Avoid placing judgment on yourself, your feelings, or your surroundings. Make note of any judgment that comes up, and let it go.  Your mind may wander, and that is okay. Make note of when your thoughts drift, and return your attention to the present  moment. To do basic mindfulness meditation:  Find a comfortable place to sit. This may include a stable chair or a firm floor cushion. ? Sit upright with your back straight. Let your arms fall next to your side with your hands resting on your legs. ? If sitting in a chair, rest your feet flat on the floor. ? If sitting on a cushion, cross your legs in front of you.  Keep your head in a neutral position with your chin dropped slightly. Relax your jaw and rest the tip of your tongue on the roof of your mouth. Drop your gaze to the floor. You can close your eyes if you like.  Breathe normally and pay attention to your breath. Feel the air moving in and out of your nose. Feel your belly expanding and relaxing with each breath.  Your mind may wander, and that is okay. Make note of when your thoughts drift, and return your attention to your breath.  Avoid placing judgment on yourself, your feelings, or your surroundings. Make note of any judgment or feelings that come up, let them go, and bring your attention back to your breath.  When you are ready, lift your gaze or open your eyes. Pay attention to how your body feels after the meditation. Where to find more information You can find more information about MBSR from:  Your health care provider.  Community-based meditation centers or programs.  Programs offered near you. Summary  Mindfulness-based stress reduction (MBSR) is a program that teaches you how to intentionally pay attention to the present moment. It is used with other treatments to help you cope better with daily stress, emotions, and pain.  MBSR focuses on developing self-awareness, which allows you to respond to life stress without judgment or negative emotions.  MBSR programs may involve learning different mindfulness practices, such as breathing exercises, meditation, yoga, body scan, or mindful eating. Find a mindfulness practice that works best for you, and set aside time  for it on a regular basis. This information is not intended to replace advice given to you by your health care provider. Make sure you discuss any questions you have with your health care provider. Document Revised: 08/27/2017 Document Reviewed: 01/21/2017 Elsevier Patient Education  2020 [employer].

## 2019-10-31 ENCOUNTER — Ambulatory Visit: Payer: BC Managed Care – PPO | Admitting: Orthotics

## 2019-10-31 ENCOUNTER — Other Ambulatory Visit: Payer: Self-pay

## 2019-10-31 DIAGNOSIS — M7671 Peroneal tendinitis, right leg: Secondary | ICD-10-CM

## 2019-10-31 DIAGNOSIS — M76821 Posterior tibial tendinitis, right leg: Secondary | ICD-10-CM

## 2019-10-31 DIAGNOSIS — M25571 Pain in right ankle and joints of right foot: Secondary | ICD-10-CM

## 2019-10-31 NOTE — Progress Notes (Signed)
Patient came in today to pick up custom made foot orthotics.  The goals were accomplished and the patient reported no dissatisfaction with said orthotics.  Patient was advised of breakin period and how to report any issues. 

## 2019-12-08 ENCOUNTER — Other Ambulatory Visit: Payer: BC Managed Care – PPO

## 2019-12-12 ENCOUNTER — Ambulatory Visit: Payer: BC Managed Care – PPO | Admitting: Podiatry

## 2019-12-12 ENCOUNTER — Encounter: Payer: Self-pay | Admitting: Podiatry

## 2019-12-12 ENCOUNTER — Other Ambulatory Visit: Payer: Self-pay

## 2019-12-12 DIAGNOSIS — Z01818 Encounter for other preprocedural examination: Secondary | ICD-10-CM | POA: Diagnosis not present

## 2019-12-12 DIAGNOSIS — M76821 Posterior tibial tendinitis, right leg: Secondary | ICD-10-CM | POA: Diagnosis not present

## 2019-12-12 DIAGNOSIS — M25571 Pain in right ankle and joints of right foot: Secondary | ICD-10-CM | POA: Diagnosis not present

## 2019-12-12 DIAGNOSIS — M7671 Peroneal tendinitis, right leg: Secondary | ICD-10-CM

## 2019-12-12 NOTE — Patient Instructions (Signed)
Pre-Operative Instructions  Congratulations, you have decided to take an important step towards improving your quality of life.  You can be assured that the doctors and staff at [facility] will be with you every step of the way.  Here are some important things you should know:  1. Plan to be at the surgery center/hospital at least 1 (one) hour prior to your scheduled time, unless otherwise directed by the surgical center/hospital staff.  You must have a responsible adult accompany you, remain during the surgery and drive you home.  Make sure you have directions to the surgical center/hospital to ensure you arrive on time. 2. If you are having surgery at [facility] or [facility] hospitals, you will need a copy of your medical history and physical form from your family physician within one month prior to the date of surgery. We will give you a form for your primary physician to complete.  3. We make every effort to accommodate the date you request for surgery.  However, there are times where surgery dates or times have to be moved.  We will contact you as soon as possible if a change in schedule is required.   4. No aspirin/ibuprofen for one week before surgery.  If you are on aspirin, any non-steroidal anti-inflammatory medications (Mobic, Aleve, Ibuprofen) should not be taken seven (7) days prior to your surgery.  You make take Tylenol for pain prior to surgery.  5. Medications - If you are taking daily heart and blood pressure medications, seizure, reflux, allergy, asthma, anxiety, pain or diabetes medications, make sure you notify the surgery center/hospital before the day of surgery so they can tell you which medications you should take or avoid the day of surgery. 6. No food or drink after midnight the night before surgery unless directed otherwise by surgical center/hospital staff. 7. No alcoholic beverages 24-hours prior to surgery.  No smoking 24-hours prior or 24-hours after  surgery. 8. Wear loose pants or shorts. They should be loose enough to fit over bandages, boots, and casts. 9. Don't wear slip-on shoes. Sneakers are preferred. 10. Bring your boot with you to the surgery center/hospital.  Also bring crutches or a walker if your physician has prescribed it for you.  If you do not have this equipment, it will be provided for you after surgery. 11. If you have not been contacted by the surgery center/hospital by the day before your surgery, call to confirm the date and time of your surgery. 12. Leave-time from work may vary depending on the type of surgery you have.  Appropriate arrangements should be made prior to surgery with your employer. 13. Prescriptions will be provided immediately following surgery by your doctor.  Fill these as soon as possible after surgery and take the medication as directed. Pain medications will not be refilled on weekends and must be approved by the doctor. 14. Remove nail polish on the operative foot and avoid getting pedicures prior to surgery. 15. Wash the night before surgery.  The night before surgery wash the foot and leg well with water and the antibacterial soap provided. Be sure to pay special attention to beneath the toenails and in between the toes.  Wash for at least three (3) minutes. Rinse thoroughly with water and dry well with a towel.  Perform this wash unless told not to do so by your physician.  Enclosed: 1 Ice pack (please put in freezer the night before surgery)   1 Hibiclens skin cleaner     Pre-op instructions  If you have any questions regarding the instructions, please do not hesitate to call our office.  [place]: [address], [place], [place] [address] -- [phone]  [place]: [address] [place], [place] [address] -- [phone]  [place]: [address] [place] [address], [place], [place] [address] -- [phone]   Website: [email or web address] 

## 2019-12-13 ENCOUNTER — Other Ambulatory Visit (INDEPENDENT_AMBULATORY_CARE_PROVIDER_SITE_OTHER): Payer: BC Managed Care – PPO

## 2019-12-13 ENCOUNTER — Telehealth: Payer: Self-pay | Admitting: *Deleted

## 2019-12-13 ENCOUNTER — Telehealth: Payer: Self-pay

## 2019-12-13 ENCOUNTER — Telehealth: Payer: Self-pay | Admitting: Internal Medicine

## 2019-12-13 ENCOUNTER — Encounter: Payer: Self-pay | Admitting: Podiatry

## 2019-12-13 DIAGNOSIS — Z0000 Encounter for general adult medical examination without abnormal findings: Secondary | ICD-10-CM

## 2019-12-13 DIAGNOSIS — E559 Vitamin D deficiency, unspecified: Secondary | ICD-10-CM | POA: Diagnosis not present

## 2019-12-13 DIAGNOSIS — Z1389 Encounter for screening for other disorder: Secondary | ICD-10-CM

## 2019-12-13 DIAGNOSIS — Z1329 Encounter for screening for other suspected endocrine disorder: Secondary | ICD-10-CM | POA: Diagnosis not present

## 2019-12-13 DIAGNOSIS — Z13220 Encounter for screening for lipoid disorders: Secondary | ICD-10-CM

## 2019-12-13 NOTE — Telephone Encounter (Addendum)
DOS 01/01/20  REPAIR POST TIBIAL TENDON RT - 27659 REPAIR TENDON RT - 28086  BCBS EFFECTIVE - 09/29/19  In-Network   Max Per Benefit Period Year-to-Date Remaining     CoInsurance 30%      Deductible $1500.00 $1100.32     Out-Of-Pocket $5900.00 $5325.16  Copay Not Applicable Coinsurance 30%  per  SERVICE YEAR

## 2019-12-13 NOTE — Telephone Encounter (Signed)
Pt needs refill on birth control. Pt ran out three days ago.

## 2019-12-13 NOTE — Telephone Encounter (Signed)
Patient has questions concerning upcoming ankle surgery, exact name of the surgery,how long will it take and will she be sedated? Please call.

## 2019-12-13 NOTE — Progress Notes (Signed)
Subjective:  Patient ID: Dana Beck, female    DOB: 24-Aug-1996,  MRN: [id number]  Chief Complaint  Patient presents with  . Foot Pain    pt is here for a f/u of foot pain to the right foot, pt states that she recently got her mri done. Pt also states that the right foot pain is elevated to the touch.    24 y.o. female presents with the above complaint.  She presents with painful right ankle with a feeling of chronic ankle instability.  This has been going on since 2017.  She is to be a chronic ankle sprain her.  She states that she has tried everything from going in a boot to ankle bracing injections in the area.  However none of that has really helped.  She states that she is aggressive on her feet because she does long distance walking dancing yoga.  She states that she has been treated at the hospital for.  She denies any other acute complaints.   Patient is following up with the pain that is still about the same.  She has been wearing her shoes as well as with orthotics but has not help with the pain at all.  She is not getting any better.  She has tried immobilization.  She has tried injection in the area.  But none of that has given her any resolve meant.  Her pain scale is 8 out of 10.  At this point she would like to consider surgical intervention given the MRI findings from last visit.  She denies any other acute complaints.  Her pain is both on the inside of the foot as well as the outside of the foot.   Review of Systems: Negative except as noted in the HPI. Denies N/V/F/Ch.  Past Medical History:  Diagnosis Date  . Anxiety   . Asthma   . COVID-19    07/29/19  . Depression   . GERD (gastroesophageal reflux disease)    in past  . Heart murmur    as infant  . 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 Migraine     Current Outpatient Medications:  .  albuterol (PROAIR HFA) 108 (90 Base) MCG/ACT inhaler, Inhale 1-2 puffs into the lungs every 6 (six) hours as needed for  wheezing or shortness of breath., Disp: 1 Inhaler, Rfl: 12 .  amoxicillin (AMOXIL) 500 MG tablet, Take 500 mg by mouth 3 (three) times daily., Disp: , Rfl:  .  escitalopram (LEXAPRO) 20 MG tablet, Take 1 tablet (20 mg total) by mouth daily. Start 03/24/2019 in am, Disp: 90 tablet, Rfl: 3 .  fluticasone (FLONASE) 50 MCG/ACT nasal spray, Place into both nostrils daily as needed for allergies or rhinitis., Disp: , Rfl:   Social History   Tobacco Use  Smoking Status Never Smoker  Smokeless Tobacco Never Used    No Known Allergies Objective:   There were no vitals filed for this visit. There is no height or weight on file to calculate BMI. Constitutional Well developed. Well nourished.  Vascular Dorsalis pedis pulses palpable bilaterally. Posterior tibial pulses palpable bilaterally. Capillary refill normal to all digits.  No cyanosis or clubbing noted. Pedal hair growth normal.  Neurologic Normal speech. Oriented to person, place, and time. Epicritic sensation to light touch grossly present bilaterally.  Dermatologic Nails well groomed and normal in appearance. No open wounds. No skin lesions.  Orthopedic:  Pain on palpation to the right peroneal tendon along  the course behind the malleoli.  Pain with eversion and inversion of the foot resisted as well as active and passive no pain at the insertion of the fifth metatarsal base pain on eversion active and passive.  No pain with dorsiflexion or plantarflexion of the ankle joint  Pain on palpation to the posterior tibial tendon pain on inversion of the foot active and passive.  Pain with inversion and eversion of the foot resisted and active and passive.  No pain at the insertion of the navicular tuberosity.  No pain with dorsiflexion or plantarflexion of the ankle joint  No pain at the ATFL and Achilles tendon insertion.  No deep pain within the ankle joint.   Radiographs:1. Minimal tenosynovitis of the posterior tibialis tendon at  the level of the medial malleolus. 2. Slight chronic thickening of the anterior talofibular ligament consistent with remote injury. 3. No other significant abnormalities. The edema at the medial aspect of the ankle joint noted on the prior study has resolved. Assessment:   1. Posterior tibial tendinitis of right leg   2. Right ankle pain, unspecified chronicity   3. Peroneal tendinitis of lower leg, right   4. Preop examination    Plan:  Patient was evaluated and treated and all questions answered.  Right peroneal and posterior tibial tendinitis retromalleolar -Patient has tried multiple conservative therapy including cam boot bracing injections in the area.  However none of that has truly helped.  Patient has failed essentially all conservative therapy at this time I believe patient will benefit from a surgical intervention of right posterior tibial tendon evaluation and repair of an right peroneal tendon evaluation and repair.  Her pain is isolated to the retromalleolar region of both the tendons no pain on insertion there from a planning on any advancement procedure or using an anchor screw. -MRI was reviewed again with the patient which showed tendinitis of the posterior tibial tendon with mild tenosynovitis.  Upon my review of the MRI also flattening of the posterior tibial tendon which is considered likely moderate.  I am unable to appreciate anything at the peroneal tendon however clinically she is having pain retromalleoli are to the peroneal tendon and I believe patient will benefit from evaluation of the tendon surgically with repairing of any small tear versus removing any tenosynovitis. -I extensively discussed with the patient my clinical goals in terms of pain reduction.  I did not guarantee any outcomes of the procedure.  I discussed with the patient extensively my postop protocol which includes nonweightbearing.  Followed by partial weightbearing with the boot on and then  transition to regular sneakers.  Patient states understanding and will be compliant with the postop protocol. -Informed surgical risk consent was reviewed and read aloud to the patient.  I reviewed the films.  I have discussed my findings with the patient in great detail.  I have discussed all risks including but not limited to infection, stiffness, scarring, limp, disability, deformity, damage to blood vessels and nerves, numbness, poor healing, need for braces, arthritis, chronic pain, amputation, death.  All benefits and realistic expectations discussed in great detail.  I have made no promises as to the outcome.  I have provided realistic expectations.  I have offered the patient a 2nd opinion, which they have declined and assured me they preferred to proceed despite the risks    No follow-ups on file.

## 2019-12-14 ENCOUNTER — Encounter: Payer: Self-pay | Admitting: Podiatry

## 2019-12-14 ENCOUNTER — Telehealth: Payer: Self-pay

## 2019-12-14 ENCOUNTER — Other Ambulatory Visit: Payer: Self-pay | Admitting: Obstetrics and Gynecology

## 2019-12-14 LAB — LIPID PANEL
Cholesterol: 159 mg/dL (ref 0–200)
HDL: 69.5 mg/dL (ref >39.00)
LDL Cholesterol: 83 mg/dL (ref 0–99)
NonHDL: 89.84
Total CHOL/HDL Ratio: 2
Triglycerides: 32 mg/dL (ref 0.0–149.0)
VLDL: 6.4 mg/dL (ref 0.0–40.0)

## 2019-12-14 LAB — CBC WITH DIFFERENTIAL/PLATELET
Basophils Absolute: 0.1 10*3/uL (ref 0.0–0.1)
Basophils Relative: 1.2 % (ref 0.0–3.0)
Eosinophils Absolute: 0.1 10*3/uL (ref 0.0–0.7)
Eosinophils Relative: 1.4 % (ref 0.0–5.0)
HCT: 41.1 % (ref 36.0–46.0)
Hemoglobin: 13.9 g/dL (ref 12.0–15.0)
Lymphocytes Relative: 52.4 % — ABNORMAL HIGH (ref 12.0–46.0)
Lymphs Abs: 3.8 10*3/uL (ref 0.7–4.0)
MCHC: 33.8 g/dL (ref 30.0–36.0)
MCV: 94.5 fl (ref 78.0–100.0)
Monocytes Absolute: 0.5 10*3/uL (ref 0.1–1.0)
Monocytes Relative: 7 % (ref 3.0–12.0)
Neutro Abs: 2.7 10*3/uL (ref 1.4–7.7)
Neutrophils Relative %: 38 % — ABNORMAL LOW (ref 43.0–77.0)
Platelets: 320 10*3/uL (ref 150.0–400.0)
RBC: 4.35 Mil/uL (ref 3.87–5.11)
RDW: 12.2 % (ref 11.5–15.5)
WBC: 7.2 10*3/uL (ref 4.0–10.5)

## 2019-12-14 LAB — COMPREHENSIVE METABOLIC PANEL
ALT: 24 U/L (ref 0–35)
AST: 19 U/L (ref 0–37)
Albumin: 4 g/dL (ref 3.5–5.2)
Alkaline Phosphatase: 58 U/L (ref 39–117)
BUN: 11 mg/dL (ref 6–23)
CO2: 30 mEq/L (ref 19–32)
Calcium: 9.4 mg/dL (ref 8.4–10.5)
Chloride: 103 mEq/L (ref 96–112)
Creatinine, Ser: 0.72 mg/dL (ref 0.40–1.20)
GFR: 120.99 mL/min (ref >60.00)
Glucose, Bld: 90 mg/dL (ref 70–99)
Potassium: 3.8 mEq/L (ref 3.5–5.1)
Sodium: 137 mEq/L (ref 135–145)
Total Bilirubin: 0.3 mg/dL (ref 0.2–1.2)
Total Protein: 7.1 g/dL (ref 6.0–8.3)

## 2019-12-14 LAB — URINALYSIS, ROUTINE W REFLEX MICROSCOPIC
Bilirubin Urine: NEGATIVE
Glucose, UA: NEGATIVE
Hgb urine dipstick: NEGATIVE
Ketones, ur: NEGATIVE
Leukocytes,Ua: NEGATIVE
Nitrite: NEGATIVE
Protein, ur: NEGATIVE
Specific Gravity, Urine: 1.025 (ref 1.001–1.03)
pH: >=8.5 — AB (ref 5.0–8.0)

## 2019-12-14 LAB — VITAMIN D 25 HYDROXY (VIT D DEFICIENCY, FRACTURES): VITD: 37.56 ng/mL (ref 30.00–100.00)

## 2019-12-14 LAB — TSH: TSH: 1.18 u[IU]/mL (ref 0.35–4.50)

## 2019-12-14 MED ORDER — LEVONORGESTREL-ETHINYL ESTRAD 0.1-20 MG-MCG PO TABS: ORAL_TABLET | ORAL | 0 refills | Status: DC

## 2019-12-14 NOTE — Telephone Encounter (Signed)
Dana Beck, Dana Spillers, MD  12/14/2019  1:04 PM EDT    Call [facility] for refills on birth control Labs normal even vitamin D rec D3 4000-5000 daily otc   No answer, no voicemail.

## 2019-12-14 NOTE — Telephone Encounter (Addendum)
Tried to call about about her questions for surgery. No answer and mail box is full.

## 2019-12-14 NOTE — Telephone Encounter (Signed)
Pt aware.

## 2019-12-14 NOTE — Telephone Encounter (Signed)
Pls notify pt [facility] eRxd. Thx

## 2019-12-14 NOTE — Telephone Encounter (Signed)
She gets this from [facility] call them   TMS

## 2019-12-14 NOTE — Telephone Encounter (Signed)
Pt is scheduling her annual now but would like a refill on her BC Vinva.

## 2019-12-14 NOTE — Progress Notes (Signed)
Rx RF [facility] till 4/21 annual.

## 2019-12-14 NOTE — Telephone Encounter (Signed)
VIENVA 0.1-20 MG-MCG tablet [[id number]]  DISCONTINUED   Order Details Dose, Route, Frequency: As Directed  Dispense Quantity: 3 Package Refills: 2        Sig: TK 1 T PO QD       Start Date: 03/30/19 End Date: 07/19/19  Discontinued by: Bronwen Betters, CMA on 07/19/2019 15:59  Reason: Patient has not taken in last 30 days    Okay to send this back in?

## 2019-12-14 NOTE — Telephone Encounter (Signed)
No answer, no voicemail.

## 2019-12-15 ENCOUNTER — Telehealth: Payer: Self-pay

## 2019-12-15 NOTE — Telephone Encounter (Signed)
Patient called and wanted to cancel her surgery for 12/18/19. She stated she has to much going on at work and will call back sometime in the summer to reschedule.

## 2019-12-19 NOTE — Telephone Encounter (Signed)
No answer, no voicemail.   Sent patient a [employer] [facility].

## 2019-12-26 ENCOUNTER — Encounter: Payer: BC Managed Care – PPO | Admitting: Podiatry

## 2020-01-01 DIAGNOSIS — M65871 Other synovitis and tenosynovitis, right ankle and foot: Secondary | ICD-10-CM

## 2020-01-01 MED ORDER — OXYCODONE-ACETAMINOPHEN 10-325 MG PO TABS: 1.0000 | ORAL_TABLET | Freq: Four times a day (QID) | ORAL | 0 refills | Status: AC | PRN

## 2020-01-01 MED ORDER — IBUPROFEN 800 MG PO TABS: 800.0000 mg | ORAL_TABLET | Freq: Four times a day (QID) | ORAL | 1 refills | Status: AC | PRN

## 2020-01-01 NOTE — Addendum Note (Signed)
Addended by: Nicholes Rough on: 01/01/2020 08:59 AM   Modules accepted: Orders

## 2020-01-02 ENCOUNTER — Encounter: Payer: BC Managed Care – PPO | Admitting: Podiatry

## 2020-01-04 ENCOUNTER — Telehealth: Payer: Self-pay

## 2020-01-04 NOTE — Telephone Encounter (Signed)
How often is she taking pain medications? She can take it percocet 2 hours then iburpofen 2 hours later and then 2 hours after that percocet.

## 2020-01-04 NOTE — Telephone Encounter (Signed)
Patient called stated that she has a constant shooting, burning pain in her leg and foot that pain medication is not helping with.  She said she is taking the Oxycodone and Ibuprofen prescribed but its not doing anything.  She denies any redness to leg or toes just past cast.  Please advise on other treatment options.

## 2020-01-04 NOTE — Telephone Encounter (Signed)
Patient was notified of Dr. Eliane Decree directions with the medication.  She stated that it is a little better this afternoon, but she will try alternating medication every 2 hours per his recommendation.

## 2020-01-09 ENCOUNTER — Encounter: Payer: BC Managed Care – PPO | Admitting: Podiatry

## 2020-01-09 ENCOUNTER — Other Ambulatory Visit: Payer: Self-pay

## 2020-01-09 ENCOUNTER — Encounter: Payer: Self-pay | Admitting: Podiatry

## 2020-01-09 ENCOUNTER — Ambulatory Visit (INDEPENDENT_AMBULATORY_CARE_PROVIDER_SITE_OTHER): Payer: BC Managed Care – PPO | Admitting: Podiatry

## 2020-01-09 DIAGNOSIS — M7671 Peroneal tendinitis, right leg: Secondary | ICD-10-CM

## 2020-01-09 DIAGNOSIS — Z9889 Other specified postprocedural states: Secondary | ICD-10-CM

## 2020-01-09 DIAGNOSIS — M76821 Posterior tibial tendinitis, right leg: Secondary | ICD-10-CM

## 2020-01-09 NOTE — Progress Notes (Signed)
  Subjective:  Patient ID: Dana Beck, female    DOB: Jul 15, 1996,  MRN: [id number]  Chief Complaint  Patient presents with  . Routine Post Op    POV #1 DOS 01/01/20 RT POSTERIOR TIBIAL TENDON REPAIR, PERONEAL TENDON REPAIR W/POSSIBLE ANCHOR    24 y.o. female returns for post-op check.  Patient states she is doing really well.  She states that she had pain during the acute phase of postop however pain is well controlled now.  Patient states the cast is fine has not been causing her any issues.  Patient has been taking her pain meds.  Pain has gotten a lot better.  She denies any other acute complaints.  No calf pain.  Review of Systems: Negative except as noted in the HPI. Denies N/V/F/Ch.  Past Medical History:  Diagnosis Date  . Anxiety   . Asthma   . COVID-19    07/29/19  . Depression   . GERD (gastroesophageal reflux disease)    in past  . Heart murmur    as infant  . 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 Migraine     Current Outpatient Medications:  .  acetaminophen-codeine (TYLENOL #3) 300-30 MG tablet, Take 1 tablet by mouth every 6 (six) hours as needed., Disp: , Rfl:  .  albuterol (PROAIR HFA) 108 (90 Base) MCG/ACT inhaler, Inhale 1-2 puffs into the lungs every 6 (six) hours as needed for wheezing or shortness of breath., Disp: 1 Inhaler, Rfl: 12 .  escitalopram (LEXAPRO) 20 MG tablet, Take 1 tablet (20 mg total) by mouth daily. Start 03/24/2019 in am, Disp: 90 tablet, Rfl: 3 .  fluticasone (FLONASE) 50 MCG/ACT nasal spray, Place into both nostrils daily as needed for allergies or rhinitis., Disp: , Rfl:  .  ibuprofen (ADVIL) 800 MG tablet, Take 1 tablet (800 mg total) by mouth every 6 (six) hours as needed., Disp: 60 tablet, Rfl: 1 .  levonorgestrel-ethinyl estradiol (VIENVA) 0.1-20 MG-MCG tablet, TK 1 T PO QD, Disp: 3 Package, Rfl: 0 .  oxyCODONE-acetaminophen (PERCOCET) 10-325 MG tablet, Take 1 tablet by mouth every 6 (six) hours as needed for up to 8 days for  pain., Disp: 30 tablet, Rfl: 0  Social History   Tobacco Use  Smoking Status Never Smoker  Smokeless Tobacco Never Used    No Known Allergies Objective:  There were no vitals filed for this visit. There is no height or weight on file to calculate BMI. Constitutional Well developed. Well nourished.  Vascular Foot warm and well perfused. Capillary refill normal to all digits.   Neurologic Normal speech. Oriented to person, place, and time. Epicritic sensation to light touch grossly present bilaterally.  Dermatologic  cast was not taken off.  No calf pain.  Motor or sensory functions are intact  Orthopedic:  Cast was not taken out off therefore was not properly evaluating.   Radiographs: None Assessment:   1. Posterior tibial tendinitis of right leg   2. Peroneal tendinitis of lower leg, right   3. Status post foot surgery    Plan:  Patient was evaluated and treated and all questions answered.  S/p foot surgery right -Progressing as expected post-operatively. -XR: None -WB Status: Nonweightbearing in cast with crutches -Sutures: Cast will be taken down next week and the incision site will be evaluated. -Medications: None -Foot redressed.  No follow-ups on file.

## 2020-01-11 ENCOUNTER — Encounter: Payer: BC Managed Care – PPO | Admitting: Podiatry

## 2020-01-11 ENCOUNTER — Telehealth: Payer: Self-pay

## 2020-01-11 MED ORDER — OXYCODONE-ACETAMINOPHEN 10-325 MG PO TABS: 1.0000 | ORAL_TABLET | Freq: Four times a day (QID) | ORAL | 0 refills | Status: AC | PRN

## 2020-01-11 NOTE — Addendum Note (Signed)
Addended by: Nicholes Rough on: 01/11/2020 01:19 PM   Modules accepted: Orders

## 2020-01-11 NOTE — Telephone Encounter (Signed)
Patient called, left message requesting a refill on her Oxycodone.  Please let me know when done.  Thanks

## 2020-01-11 NOTE — Telephone Encounter (Signed)
Done

## 2020-01-16 ENCOUNTER — Ambulatory Visit (INDEPENDENT_AMBULATORY_CARE_PROVIDER_SITE_OTHER): Payer: BC Managed Care – PPO | Admitting: Podiatry

## 2020-01-16 ENCOUNTER — Other Ambulatory Visit: Payer: Self-pay

## 2020-01-16 ENCOUNTER — Encounter: Payer: BC Managed Care – PPO | Admitting: Podiatry

## 2020-01-16 ENCOUNTER — Encounter: Payer: Self-pay | Admitting: Podiatry

## 2020-01-16 ENCOUNTER — Encounter: Payer: Self-pay | Admitting: *Deleted

## 2020-01-16 DIAGNOSIS — M76821 Posterior tibial tendinitis, right leg: Secondary | ICD-10-CM

## 2020-01-16 DIAGNOSIS — M7671 Peroneal tendinitis, right leg: Secondary | ICD-10-CM

## 2020-01-16 DIAGNOSIS — Z9889 Other specified postprocedural states: Secondary | ICD-10-CM

## 2020-01-16 NOTE — Progress Notes (Signed)
Subjective:  Patient ID: Dana Beck, female    DOB: May 27, 1996,  MRN: [id number]  Chief Complaint  Patient presents with  . Routine Post Op    POV #2 DOS 01/01/20 RT POSTERIOR TIBIAL TENDON REPAIR, PERONEAL TENDON REPAIR W/POSSIBLE ANCHOR    24 y.o. female returns for post-op check.  Patient states she is doing really well.  She has been nonweightbearing to the right lower extremity with a cast on.  Patient states there is no signs of infection.  She denies any acute plane.  Pain scale 7 out of 10.  Patient has been taking pain meds.  There is some swelling associated with it.  . Patient has been little bit aggressive on her foot without weightbearing therefore there is some swelling associated to the distal tip of the toe.  Review of Systems: Negative except as noted in the HPI. Denies N/V/F/Ch.  Past Medical History:  Diagnosis Date  . Anxiety   . Asthma   . COVID-19    07/29/19  . Depression   . GERD (gastroesophageal reflux disease)    in past  . Heart murmur    as infant  . 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 Migraine     Current Outpatient Medications:  .  acetaminophen-codeine (TYLENOL #3) 300-30 MG tablet, Take 1 tablet by mouth every 6 (six) hours as needed., Disp: , Rfl:  .  albuterol (PROAIR HFA) 108 (90 Base) MCG/ACT inhaler, Inhale 1-2 puffs into the lungs every 6 (six) hours as needed for wheezing or shortness of breath., Disp: 1 Inhaler, Rfl: 12 .  escitalopram (LEXAPRO) 20 MG tablet, Take 1 tablet (20 mg total) by mouth daily. Start 03/24/2019 in am, Disp: 90 tablet, Rfl: 3 .  fluticasone (FLONASE) 50 MCG/ACT nasal spray, Place into both nostrils daily as needed for allergies or rhinitis., Disp: , Rfl:  .  ibuprofen (ADVIL) 800 MG tablet, Take 1 tablet (800 mg total) by mouth every 6 (six) hours as needed., Disp: 60 tablet, Rfl: 1 .  levonorgestrel-ethinyl estradiol (VIENVA) 0.1-20 MG-MCG tablet, TK 1 T PO QD, Disp: 3 Package, Rfl: 0 .   oxyCODONE-acetaminophen (PERCOCET) 10-325 MG tablet, Take 1 tablet by mouth every 6 (six) hours as needed for up to 8 days for pain., Disp: 30 tablet, Rfl: 0  Social History   Tobacco Use  Smoking Status Never Smoker  Smokeless Tobacco Never Used    No Known Allergies Objective:  There were no vitals filed for this visit. There is no height or weight on file to calculate BMI. Constitutional Well developed. Well nourished.  Vascular Foot warm and well perfused. Capillary refill normal to all digits.   Neurologic Normal speech. Oriented to person, place, and time. Epicritic sensation to light touch grossly present bilaterally.  Dermatologic  cast was taken off.  The incision site was inspected.  No signs of infection noted.  Staples are intact.  Mild abrasion noted at the anterior ankle likely due to movements  Orthopedic:  Manual muscle testing 4 out of 5 likely due to general deconditioning.  Motor or sensory functions are intact.  Pain on palpation to the incision site.   Radiographs: None Assessment:   1. Posterior tibial tendinitis of right leg   2. Peroneal tendinitis of lower leg, right   3. Status post foot surgery    Plan:  Patient was evaluated and treated and all questions answered.  S/p foot surgery right -Progressing as expected post-operatively. -XR:  None -WB Status: Nonweightbearing in cast with crutches -Sutures: Cast was removed.  The incision site was inspected.  The staples were taken out.  No complications noted.  Steri-Strip reinforcement placed.. -Medications: None -Foot redressed. -I will plan on beginning her physical therapy during next visit.  Patient can start washing her foot starting next week. -I will begin transitioning from nonweightbearing to weightbearing as tolerated in a cam boot starting next clinical visit. - No follow-ups on file.

## 2020-01-17 ENCOUNTER — Ambulatory Visit: Payer: BC Managed Care – PPO | Admitting: Obstetrics and Gynecology

## 2020-01-26 ENCOUNTER — Telehealth: Payer: Self-pay | Admitting: *Deleted

## 2020-01-26 NOTE — Telephone Encounter (Signed)
"  I just left the office earlier today and Dr. Allena Katz recommended that I not work for about four more weeks.  I'm going to have to fax over [facility] paperwork.  So I'm calling to let you all know that I will need that filled out so I can get it approved by my work.  I'll probably look up your fax number on line."  I had my FMLA paperwork faxed to your office and I need Dr. Allena Katz to fill that out by this evening because I start my leave on Monday.  If you can get that done, that would be great.  FMLA papers were sent on 01/19/2020.

## 2020-01-30 ENCOUNTER — Encounter: Payer: Self-pay | Admitting: *Deleted

## 2020-01-30 ENCOUNTER — Other Ambulatory Visit: Payer: Self-pay

## 2020-01-30 ENCOUNTER — Ambulatory Visit (INDEPENDENT_AMBULATORY_CARE_PROVIDER_SITE_OTHER): Payer: BC Managed Care – PPO | Admitting: Podiatry

## 2020-01-30 ENCOUNTER — Ambulatory Visit (INDEPENDENT_AMBULATORY_CARE_PROVIDER_SITE_OTHER): Payer: BC Managed Care – PPO

## 2020-01-30 DIAGNOSIS — M76821 Posterior tibial tendinitis, right leg: Secondary | ICD-10-CM | POA: Diagnosis not present

## 2020-01-30 DIAGNOSIS — M7671 Peroneal tendinitis, right leg: Secondary | ICD-10-CM

## 2020-01-30 DIAGNOSIS — Z9889 Other specified postprocedural states: Secondary | ICD-10-CM

## 2020-01-31 ENCOUNTER — Encounter: Payer: Self-pay | Admitting: Podiatry

## 2020-01-31 NOTE — Progress Notes (Signed)
Subjective:  Patient ID: Dana Beck, female    DOB: 12-29-1995,  MRN: [id number]  Chief Complaint  Patient presents with  . Routine Post Op    POV #3 DOS 01/01/20 RT POSTERIOR TIBIAL TENDON REPAIR, PERONEAL TENDON REPAIR     24 y.o. female returns for post-op check.  Patient states she is doing really well.  She has been nonweightbearing to the right lower extremity with a crutches.  She is ready to be transition to weightbearing as tolerated.  Her skin side has completely healed.  Patient is weaning off the meds.  Patient still has some swelling associated with it.  Has she has been keeping well bandaged.  Her pain scale is now 6 out of 10.  She denies any other acute complaints.  Review of Systems: Negative except as noted in the HPI. Denies N/V/F/Ch.  Past Medical History:  Diagnosis Date  . Anxiety   . Asthma   . COVID-19    07/29/19  . Depression   . GERD (gastroesophageal reflux disease)    in past  . Heart murmur    as infant  . 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 Migraine     Current Outpatient Medications:  .  acetaminophen-codeine (TYLENOL #3) 300-30 MG tablet, Take 1 tablet by mouth every 6 (six) hours as needed., Disp: , Rfl:  .  albuterol (PROAIR HFA) 108 (90 Base) MCG/ACT inhaler, Inhale 1-2 puffs into the lungs every 6 (six) hours as needed for wheezing or shortness of breath., Disp: 1 Inhaler, Rfl: 12 .  escitalopram (LEXAPRO) 20 MG tablet, Take 1 tablet (20 mg total) by mouth daily. Start 03/24/2019 in am, Disp: 90 tablet, Rfl: 3 .  fluticasone (FLONASE) 50 MCG/ACT nasal spray, Place into both nostrils daily as needed for allergies or rhinitis., Disp: , Rfl:  .  ibuprofen (ADVIL) 800 MG tablet, Take 1 tablet (800 mg total) by mouth every 6 (six) hours as needed., Disp: 60 tablet, Rfl: 1 .  levonorgestrel-ethinyl estradiol (VIENVA) 0.1-20 MG-MCG tablet, TK 1 T PO QD, Disp: 3 Package, Rfl: 0  Social History   Tobacco Use  Smoking Status Never Smoker    Smokeless Tobacco Never Used    No Known Allergies Objective:  There were no vitals filed for this visit. There is no height or weight on file to calculate BMI. Constitutional Well developed. Well nourished.  Vascular Foot warm and well perfused. Capillary refill normal to all digits.   Neurologic Normal speech. Oriented to person, place, and time. Epicritic sensation to light touch grossly present bilaterally.  Dermatologic  cast was taken off.  The incision site was inspected.  No signs of infection noted.  Staples are intact.  Mild abrasion noted at the anterior ankle likely due to movements  Orthopedic:  Manual muscle testing 4 out of 5 likely due to general deconditioning.  Motor or sensory functions are intact.  Pain on palpation to the incision site.   Radiographs: None Assessment:   1. Posterior tibial tendinitis of right leg   2. Peroneal tendinitis of lower leg, right   3. Status post foot surgery    Plan:  Patient was evaluated and treated and all questions answered.  S/p foot surgery right -Progressing as expected post-operatively. -XR: None -WB Status: Transition from nonweightbearing to weightbearing as tolerated with a cam boot -Sutures: Skin completely reepithelialized.  Patient may begin to wash her feet. -Medications: None -Foot redressed. -Physical therapy prescription was sent  for general deconditioning, strengthening exercises, gait training. - No follow-ups on file.

## 2020-02-10 ENCOUNTER — Ambulatory Visit
Admission: EM | Admit: 2020-02-10 | Discharge: 2020-02-10 | Disposition: A | Discharge status: Home / Self Care | Payer: BC Managed Care – PPO | Attending: Family Medicine | Admitting: Family Medicine

## 2020-02-10 ENCOUNTER — Encounter: Payer: Self-pay | Admitting: Emergency Medicine

## 2020-02-10 ENCOUNTER — Other Ambulatory Visit: Payer: Self-pay

## 2020-02-10 DIAGNOSIS — M62830 Muscle spasm of back: Secondary | ICD-10-CM

## 2020-02-10 MED ORDER — KETOROLAC TROMETHAMINE 60 MG/2ML IM SOLN
60.0000 mg | Freq: Once | INTRAMUSCULAR | Status: AC
  Administered 2020-02-10: 60 mg via INTRAMUSCULAR

## 2020-02-10 MED ORDER — MELOXICAM 15 MG PO TABS: 15.0000 mg | ORAL_TABLET | Freq: Every day | ORAL | 0 refills | Status: AC | PRN

## 2020-02-10 MED ORDER — CYCLOBENZAPRINE HCL 10 MG PO TABS: 10.0000 mg | ORAL_TABLET | Freq: Two times a day (BID) | ORAL | 0 refills | Status: AC | PRN

## 2020-02-10 NOTE — ED Triage Notes (Signed)
Patient c/o right sided mid back spasms that started 3 days ago and has gotten worse this morning.  Patient denies injury or fall.

## 2020-02-10 NOTE — Discharge Instructions (Addendum)
Take medication as prescribed. Rest. Drink plenty of fluids. Ice/Heat.  Follow up with your primary care physician this week as needed. Return to Urgent care for new or worsening concerns.   

## 2020-02-10 NOTE — ED Provider Notes (Signed)
[facility] ____________________________________________  Time seen: Approximately 9:04 AM  I have reviewed the triage vital signs and the nursing notes.   HISTORY  Chief Complaint Back Pain  HPI Dana Beck is a 24 y.o. female presenting for evaluation of right back pain.  Patient reports she has been using crutches recently due to right foot surgery.  States this past Thursday she twisted quickly and felt pain in her right buttock.  States right back pain has now been more of a spasming pain.  States pain is worse with arm movement, twisting and deep breaths.  States she can feel the area twitching.  Denies rash, fall, direct injury.  Denies chest pain, shortness of breath, fevers.  Continues eat and drink well.  Did take ibuprofen last night as well as a Percocet that she had from her foot surgery without resolution.  Reports otherwise doing well.  Patient's last menstrual period was 02/07/2020 (exact date). Denies pregnancy.    Past Medical History:  Diagnosis Date  . Anxiety   . Asthma   . COVID-19    07/29/19  . Depression   . GERD (gastroesophageal reflux disease)    in past  . Heart murmur    as infant  . 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 Migraine     Patient Active Problem List   Diagnosis Date Noted  . Annual physical exam 07/19/2019  . Chronic pain of right ankle 07/19/2019  . Vitamin D deficiency 12/12/2018  . Anxiety and depression 11/30/2018  . Gastroesophageal reflux disease without esophagitis 11/30/2018  . Asthma, well controlled 11/30/2018  . Migraine without status migrainosus, not intractable 11/30/2018  . Pain in right ankle and joints of right foot 09/01/2016    Past Surgical History:  Procedure Laterality Date  . LEG SURGERY Right   . TONSILLECTOMY Bilateral 04/07/2016   Procedure: TONSILLECTOMY;  Surgeon: Geanie Logan, MD;  Location: [facility];  Service: ENT;  Laterality: Bilateral;  . TONSILLECTOMY    .  WISDOM TOOTH EXTRACTION       No current facility-administered medications for this encounter.  Current Outpatient Medications:  .  escitalopram (LEXAPRO) 20 MG tablet, Take 1 tablet (20 mg total) by mouth daily. Start 03/24/2019 in am, Disp: 90 tablet, Rfl: 3 .  fluticasone (FLONASE) 50 MCG/ACT nasal spray, Place into both nostrils daily as needed for allergies or rhinitis., Disp: , Rfl:  .  ibuprofen (ADVIL) 800 MG tablet, Take 1 tablet (800 mg total) by mouth every 6 (six) hours as needed., Disp: 60 tablet, Rfl: 1 .  levonorgestrel-ethinyl estradiol (VIENVA) 0.1-20 MG-MCG tablet, TK 1 T PO QD, Disp: 3 Package, Rfl: 0 .  acetaminophen-codeine (TYLENOL #3) 300-30 MG tablet, Take 1 tablet by mouth every 6 (six) hours as needed., Disp: , Rfl:  .  albuterol (PROAIR HFA) 108 (90 Base) MCG/ACT inhaler, Inhale 1-2 puffs into the lungs every 6 (six) hours as needed for wheezing or shortness of breath., Disp: 1 Inhaler, Rfl: 12 .  cyclobenzaprine (FLEXERIL) 10 MG tablet, Take 1 tablet (10 mg total) by mouth 2 (two) times daily as needed for muscle spasms. Do not drive while taking as can cause drowsiness, Disp: 15 tablet, Rfl: 0 .  meloxicam (MOBIC) 15 MG tablet, Take 1 tablet (15 mg total) by mouth daily as needed., Disp: 10 tablet, Rfl: 0  Allergies Patient has no known allergies.  Family History  Problem Relation Age of Onset  . Diabetes Mother   .  Hypertension Mother   . Alcohol abuse Father   . Asthma Father   . Depression Father   . Migraines Father   . Depression Sister     Social History Social History   Tobacco Use  . Smoking status: Never Smoker  . Smokeless tobacco: Never Used  Substance Use Topics  . Alcohol use: Yes    Comment: rarely  . Drug use: Never    Review of Systems Constitutional: No fever Cardiovascular: Denies chest pain. Respiratory: Denies shortness of breath. Gastrointestinal: No abdominal pain.  No nausea, no vomiting.  No diarrhea.   Genitourinary:  Negative for dysuria. Musculoskeletal: Positive for back pain. Skin: Negative for rash. ___________________________________________   PHYSICAL EXAM:  VITAL SIGNS: ED Triage Vitals  Enc Vitals Group     BP 02/10/20 0825 119/83     Pulse Rate 02/10/20 0825 91     Resp 02/10/20 0825 14     Temp 02/10/20 0825 98.3 F (36.8 C)     Temp Source 02/10/20 0825 Oral     SpO2 02/10/20 0825 97 %     Weight 02/10/20 0821 178 lb (80.7 kg)     Height 02/10/20 0821 5\' 4"  (1.626 m)     Head Circumference --      Peak Flow --      Pain Score 02/10/20 0821 9     Pain Loc --      Pain Edu? --      Excl. in GC? --     Constitutional: Alert and oriented. Well appearing and in no acute distress. Eyes: Conjunctivae are normal. ENT      Head: Normocephalic and atraumatic. Respiratory: Normal respiratory effort without tachypnea nor retractions. Breath sounds are clear and equal bilaterally. No wheezes, rales, rhonchi. Musculoskeletal:  No midline cervical, thoracic or lumbar tenderness to palpation.  Except: Right upper latissimus to mid back tenderness to palpation with palpable muscle spasm with overhead stretching, no rash, no midline tenderness. Neurologic:  Normal speech and language. Speech is normal. No gait instability.  Skin:  Skin is warm, dry and intact. No rash noted. Psychiatric: Mood and affect are normal. Speech and behavior are normal. Patient exhibits appropriate insight and judgment   ___________________________________________   LABS (all labs ordered are listed, but only abnormal results are displayed)  Labs Reviewed - No data to display   PROCEDURES Procedures   INITIAL IMPRESSION / ASSESSMENT AND PLAN / ED COURSE  Pertinent labs & imaging results that were available during my care of the patient were reviewed by me and considered in my medical decision making (see chart for details).  Well-appearing patient.  No acute distress.  Suspect muscle spasm.  Toradol 60 mg  IM given once in urgent care.  Will treat with Flexeril and Mobic.  Counseled not taking pain medication and Flexeril simultaneously.  Stretch, ice, heat, supportive care. Discussed indication, risks and benefits of medications with patient.  Discussed follow up and return parameters including no resolution or any worsening concerns. Patient verbalized understanding and agreed to plan.   ____________________________________________   FINAL CLINICAL IMPRESSION(S) / ED DIAGNOSES  Final diagnoses:  Muscle spasm of back     ED Discharge Orders         Ordered    meloxicam (MOBIC) 15 MG tablet  Daily PRN     02/10/20 0853    cyclobenzaprine (FLEXERIL) 10 MG tablet  2 times daily PRN     02/10/20 02/12/20  Note: This dictation was prepared with Dragon dictation along with smaller phrase technology. Any transcriptional errors that result from this process are unintentional.         Marylene Land, NP 02/10/20 [phone]

## 2020-02-16 ENCOUNTER — Telehealth: Payer: Self-pay | Admitting: *Deleted

## 2020-02-16 NOTE — Telephone Encounter (Signed)
FMLA paperwork complete 

## 2020-03-05 ENCOUNTER — Encounter: Payer: Self-pay | Admitting: Podiatry

## 2020-03-05 ENCOUNTER — Other Ambulatory Visit: Payer: Self-pay

## 2020-03-05 ENCOUNTER — Ambulatory Visit (INDEPENDENT_AMBULATORY_CARE_PROVIDER_SITE_OTHER): Payer: BC Managed Care – PPO | Admitting: Podiatry

## 2020-03-05 DIAGNOSIS — M76821 Posterior tibial tendinitis, right leg: Secondary | ICD-10-CM

## 2020-03-05 DIAGNOSIS — M7671 Peroneal tendinitis, right leg: Secondary | ICD-10-CM

## 2020-03-05 DIAGNOSIS — Z9889 Other specified postprocedural states: Secondary | ICD-10-CM

## 2020-03-06 ENCOUNTER — Encounter: Payer: Self-pay | Admitting: Podiatry

## 2020-03-06 NOTE — Progress Notes (Signed)
Subjective:  Patient ID: Dana Beck, female    DOB: 1996-09-21,  MRN: [id number]  Chief Complaint  Patient presents with  . Routine Post Op    DOS 01/01/20 RT POSTERIOR TIBIAL TENDON REPAIR, PERONEAL TENDON REPAIR     24 y.o. female returns for post-op check.  Patient states she is doing really well.  She has been nonweightbearing to the right lower extremity with a crutches.  She is ready to be transition to weightbearing as tolerated.  Her skin side has completely healed.  Patient is weaning off the meds.  Patient still has some swelling associated with it.  Has she has been keeping well bandaged.  Her pain scale is now 6 out of 10.  She denies any other acute complaints.  Review of Systems: Negative except as noted in the HPI. Denies N/V/F/Ch.  Past Medical History:  Diagnosis Date  . Anxiety   . Asthma   . COVID-19    07/29/19  . Depression   . GERD (gastroesophageal reflux disease)    in past  . Heart murmur    as infant  . 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 Migraine     Current Outpatient Medications:  .  acetaminophen-codeine (TYLENOL #3) 300-30 MG tablet, Take 1 tablet by mouth every 6 (six) hours as needed., Disp: , Rfl:  .  albuterol (PROAIR HFA) 108 (90 Base) MCG/ACT inhaler, Inhale 1-2 puffs into the lungs every 6 (six) hours as needed for wheezing or shortness of breath., Disp: 1 Inhaler, Rfl: 12 .  cyclobenzaprine (FLEXERIL) 10 MG tablet, Take 1 tablet (10 mg total) by mouth 2 (two) times daily as needed for muscle spasms. Do not drive while taking as can cause drowsiness, Disp: 15 tablet, Rfl: 0 .  escitalopram (LEXAPRO) 20 MG tablet, Take 1 tablet (20 mg total) by mouth daily. Start 03/24/2019 in am, Disp: 90 tablet, Rfl: 3 .  fluticasone (FLONASE) 50 MCG/ACT nasal spray, Place into both nostrils daily as needed for allergies or rhinitis., Disp: , Rfl:  .  ibuprofen (ADVIL) 800 MG tablet, Take 1 tablet (800 mg total) by mouth every 6 (six) hours as  needed., Disp: 60 tablet, Rfl: 1 .  levonorgestrel-ethinyl estradiol (VIENVA) 0.1-20 MG-MCG tablet, TK 1 T PO QD, Disp: 3 Package, Rfl: 0 .  meloxicam (MOBIC) 15 MG tablet, Take 1 tablet (15 mg total) by mouth daily as needed., Disp: 10 tablet, Rfl: 0  Social History   Tobacco Use  Smoking Status Never Smoker  Smokeless Tobacco Never Used    No Known Allergies Objective:  There were no vitals filed for this visit. There is no height or weight on file to calculate BMI. Constitutional Well developed. Well nourished.  Vascular Foot warm and well perfused. Capillary refill normal to all digits.   Neurologic Normal speech. Oriented to person, place, and time. Epicritic sensation to light touch grossly present bilaterally.  Dermatologic  skin completely reepithelialized the abrasion completely resolved.  Good range of motion noted at the ankle joint.  Mild pain on palpation as expected postoperatively on the medial side as well as the lateral side.  Good strength noted at the posterior tibial tendon as well as the peroneal tendon with eversion and inversion of the foot active and passive.  Orthopedic:  Manual muscle testing 4 out of 5 likely due to general deconditioning.  Motor or sensory functions are intact.  Pain on palpation to the incision site.   Radiographs:  None Assessment:   1. Posterior tibial tendinitis of right leg   2. Peroneal tendinitis of lower leg, right   3. Status post foot surgery    Plan:  Patient was evaluated and treated and all questions answered.  S/p foot surgery right -Progressing as expected post-operatively. -XR: None -WB Status: Transition from weightbearing as tolerated in cam boot into regular sneakers with orthotics. -Sutures: Skin completely reepithelialized.  Patient may begin to wash her feet. -Medications: None -Foot redressed. -Continue physical therapy as they can assist in transition into regular sneakers with orthotics. - No follow-ups  on file.

## 2020-03-09 ENCOUNTER — Other Ambulatory Visit: Payer: Self-pay | Admitting: Obstetrics and Gynecology

## 2020-03-13 ENCOUNTER — Other Ambulatory Visit: Payer: Self-pay

## 2020-03-13 MED ORDER — LEVONORGESTREL-ETHINYL ESTRAD 0.1-20 MG-MCG PO TABS: ORAL_TABLET | ORAL | 0 refills | Status: AC

## 2020-03-13 NOTE — Telephone Encounter (Signed)
Pt calling for refill of bc - [facility] [address]  [phone] Pt aware refill eRx'd.

## 2020-03-21 ENCOUNTER — Ambulatory Visit: Payer: BC Managed Care – PPO | Admitting: Advanced Practice Midwife

## 2020-04-02 ENCOUNTER — Encounter: Payer: Self-pay | Admitting: Podiatry

## 2020-04-02 ENCOUNTER — Ambulatory Visit (INDEPENDENT_AMBULATORY_CARE_PROVIDER_SITE_OTHER): Payer: BC Managed Care – PPO | Admitting: Podiatry

## 2020-04-02 ENCOUNTER — Other Ambulatory Visit: Payer: Self-pay

## 2020-04-02 DIAGNOSIS — M76821 Posterior tibial tendinitis, right leg: Secondary | ICD-10-CM | POA: Diagnosis not present

## 2020-04-02 DIAGNOSIS — M7671 Peroneal tendinitis, right leg: Secondary | ICD-10-CM

## 2020-04-02 NOTE — Progress Notes (Signed)
Subjective:  Patient ID: Dana Beck, female    DOB: 02-09-1996,  MRN: [id number]  Chief Complaint  Patient presents with  . Routine Post Op    DOS 01/01/20 RT POSTERIOR TIBIAL TENDON REPAIR, PERONEAL TENDON REPAIR     24 y.o. female returns for post-op check.  Patient states she is doing really well.  She has been in physical therapy which has been helping tremendously.  She has been planning on moving to [place] and will be moving sometimes over the next couple of weeks.  This will be the last visit as patient would like to get clearance.  Clinically she has improved a lot.  She has been able to transition to regular sneakers.  Review of Systems: Negative except as noted in the HPI. Denies N/V/F/Ch.  Past Medical History:  Diagnosis Date  . Anxiety   . Asthma   . COVID-19    07/29/19  . Depression   . GERD (gastroesophageal reflux disease)    in past  . Heart murmur    as infant  . 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 Migraine     Current Outpatient Medications:  .  acetaminophen-codeine (TYLENOL #3) 300-30 MG tablet, Take 1 tablet by mouth every 6 (six) hours as needed., Disp: , Rfl:  .  albuterol (PROAIR HFA) 108 (90 Base) MCG/ACT inhaler, Inhale 1-2 puffs into the lungs every 6 (six) hours as needed for wheezing or shortness of breath., Disp: 1 Inhaler, Rfl: 12 .  cyclobenzaprine (FLEXERIL) 10 MG tablet, Take 1 tablet (10 mg total) by mouth 2 (two) times daily as needed for muscle spasms. Do not drive while taking as can cause drowsiness, Disp: 15 tablet, Rfl: 0 .  escitalopram (LEXAPRO) 20 MG tablet, Take 1 tablet (20 mg total) by mouth daily. Start 03/24/2019 in am, Disp: 90 tablet, Rfl: 3 .  fluticasone (FLONASE) 50 MCG/ACT nasal spray, Place into both nostrils daily as needed for allergies or rhinitis., Disp: , Rfl:  .  ibuprofen (ADVIL) 800 MG tablet, Take 1 tablet (800 mg total) by mouth every 6 (six) hours as needed., Disp: 60 tablet, Rfl: 1 .   levonorgestrel-ethinyl estradiol (VIENVA) 0.1-20 MG-MCG tablet, TK 1 T PO QD, Disp: 84 tablet, Rfl: 0 .  meloxicam (MOBIC) 15 MG tablet, Take 1 tablet (15 mg total) by mouth daily as needed., Disp: 10 tablet, Rfl: 0  Social History   Tobacco Use  Smoking Status Never Smoker  Smokeless Tobacco Never Used    No Known Allergies Objective:  There were no vitals filed for this visit. There is no height or weight on file to calculate BMI. Constitutional Well developed. Well nourished.  Vascular Foot warm and well perfused. Capillary refill normal to all digits.   Neurologic Normal speech. Oriented to person, place, and time. Epicritic sensation to light touch grossly present bilaterally.  Dermatologic  skin completely reepithelialized the abrasion completely resolved.  Good range of motion noted at the ankle joint.  Mild pain on palpation as expected postoperatively on the medial side as well as the lateral side.  Good strength noted at the posterior tibial tendon as well as the peroneal tendon with eversion and inversion of the foot active and passive.  Orthopedic:  Manual muscle testing 4 out of 5 likely due to general deconditioning.  Motor or sensory functions are intact.  Pain on palpation to the incision site.   Radiographs: None Assessment:   1. Peroneal tendinitis of lower  leg, right   2. Posterior tibial tendinitis of right leg    Plan:  Patient was evaluated and treated and all questions answered.  S/p foot surgery right -Progressing as expected post-operatively. -XR: None -WB Status: Successfully transition to regular sneakers with orthotics -Sutures: Skin completely reepithelialized.  Patient may begin to wash her feet. -Medications: None -Foot redressed. -Continue physical therapy if needed -Given that patient has clinically improved considerably, she is officially discharged from my care.  If any foot and ankle issues arise in the future I have asked her to come back  and see me. No follow-ups on file.

## 2020-04-09 ENCOUNTER — Other Ambulatory Visit: Payer: BC Managed Care – PPO

## 2020-04-09 ENCOUNTER — Encounter: Payer: BC Managed Care – PPO | Admitting: Obstetrics and Gynecology

## 2020-04-23 ENCOUNTER — Ambulatory Visit: Payer: BC Managed Care – PPO | Admitting: Internal Medicine

## 2020-04-23 ENCOUNTER — Telehealth: Payer: Self-pay | Admitting: Internal Medicine

## 2020-04-23 NOTE — Telephone Encounter (Signed)
Patient no-showed today's appointment; appointment was for 04/23/20 at 3:30, provider notified for review of record. Mychart sent to re-schedule.

## 2021-11-19 IMAGING — MR MR ANKLE*R* W/O CM
5 series · 40 of 40 positions shown · non-contrast
Comparison: Radiographs dated 07/25/2019 and MRI dated 08/31/2016

CLINICAL DATA: Chronic right ankle pain and instability since 0568.

EXAM:
MRI OF THE RIGHT ANKLE WITHOUT CONTRAST
TECHNIQUE: Multiplanar, multisequence MR imaging of the ankle was performed. No
intravenous contrast was administered.

[Series 4: PD fat-sat · axial · right · 3.0mm · 0.50mm/px · z∈[-73,+66]mm · 10 of 36 slices shown]
[im 1/36]
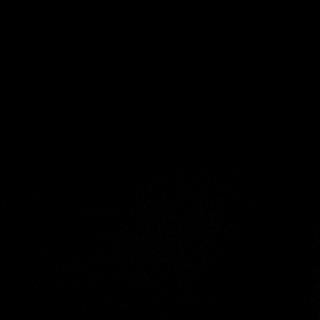
[im 4/36]
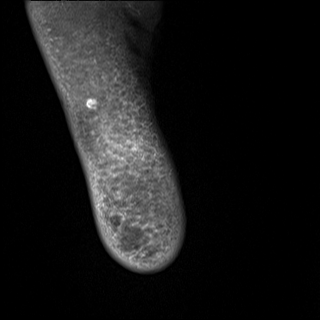
[im 8/36]
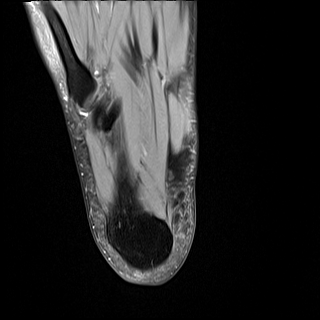
[im 12/36]
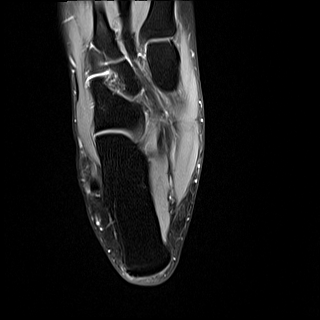
[im 16/36]
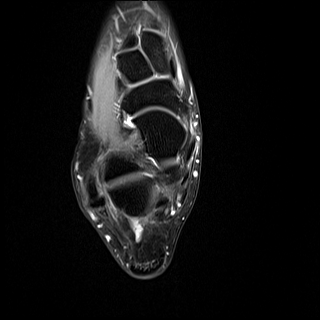
[im 20/36]
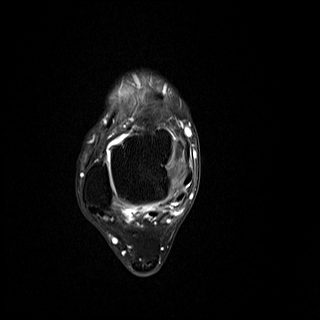
[im 24/36]
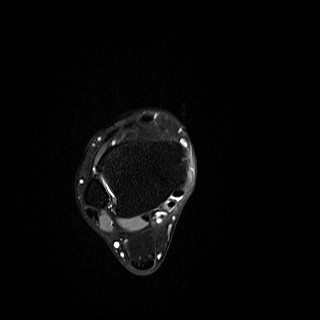
[im 28/36]
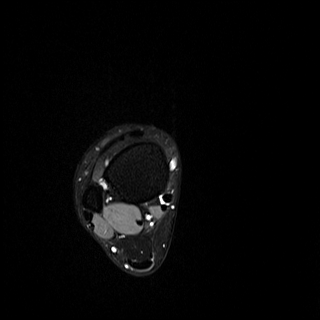
[im 32/36]
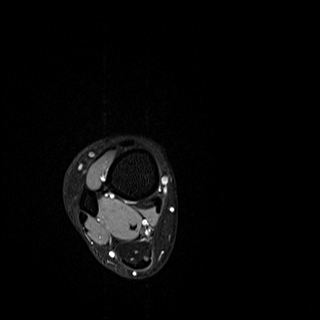
[im 36/36]
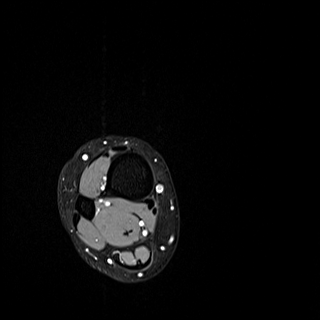

[Series 5: T2 fat-sat · axial · right · 3.0mm · 0.50mm/px · z∈[-68,+71]mm · 10 of 36 slices shown (1 of 2)]
[im 1/36]
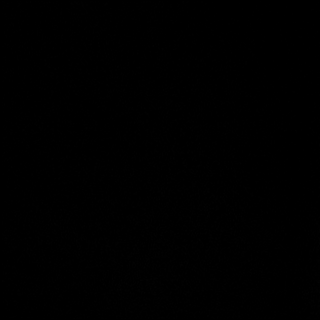
[im 4/36]
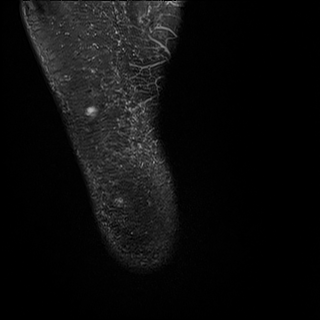
[im 8/36]
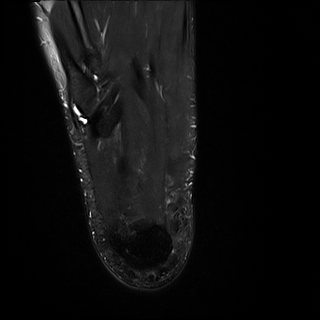
[im 12/36]
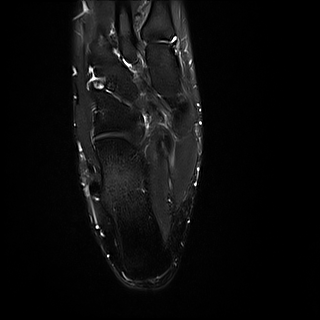
[im 16/36]
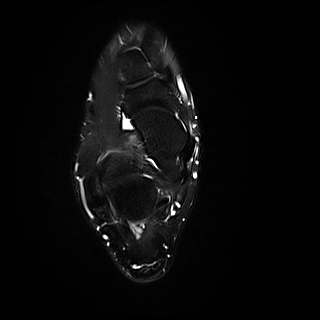
[im 20/36]
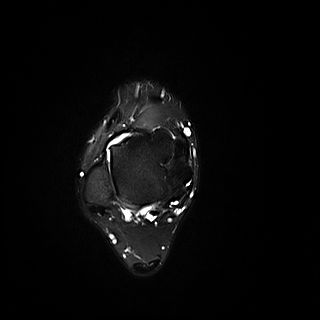
[im 24/36]
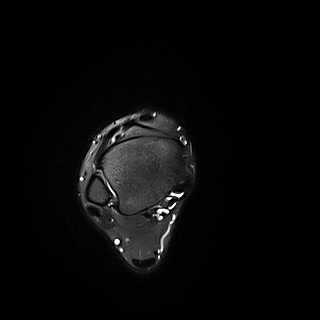
[im 28/36]
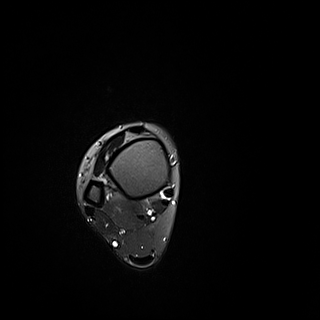
[im 32/36]
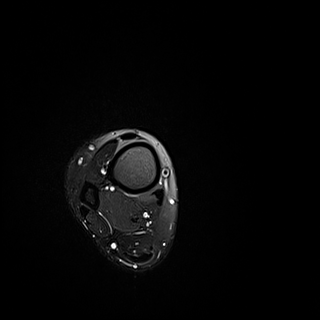
[im 36/36]
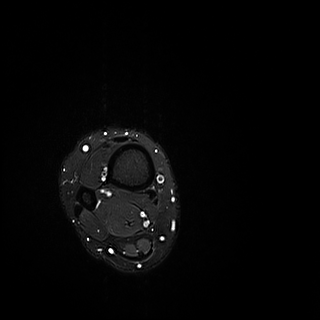

[Series 6: T2 fat-sat · coronal · right · 3.0mm · 0.62mm/px · 10 of 40 slices shown (2 of 2)]
[im 1/40]
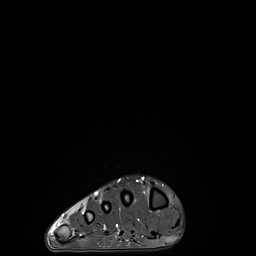
[im 5/40]
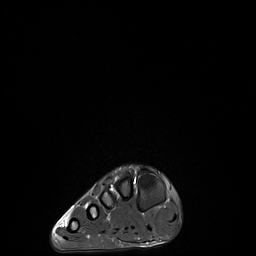
[im 9/40]
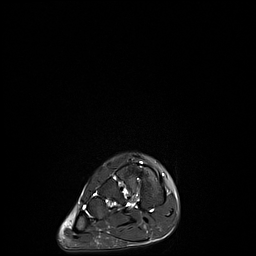
[im 14/40]
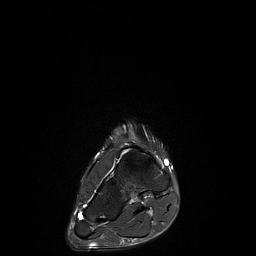
[im 18/40]
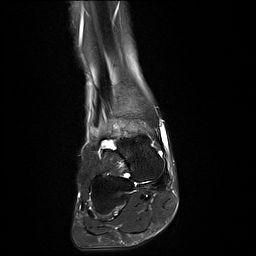
[im 22/40]
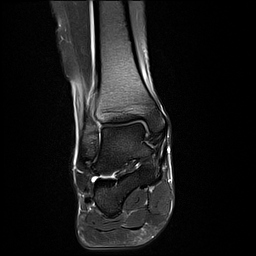
[im 27/40]
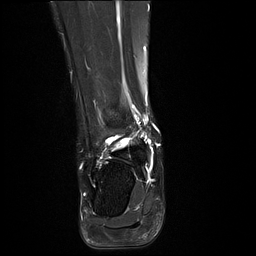
[im 31/40]
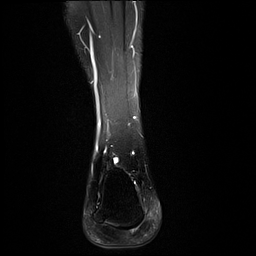
[im 35/40]
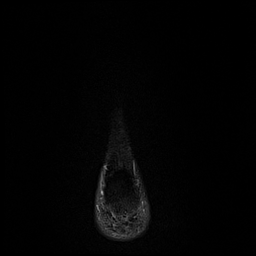
[im 40/40]
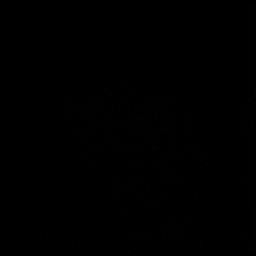

[Series 7: T1 · sagittal · right · 4.0mm · 0.70mm/px · 5 of 21 slices shown]
[im 1/21]
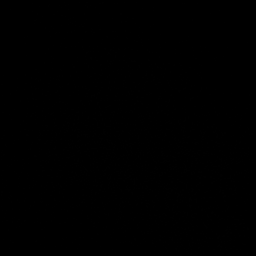
[im 6/21]
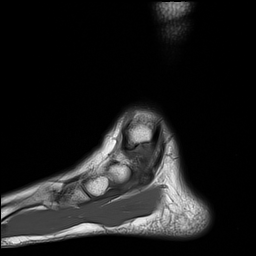
[im 11/21]
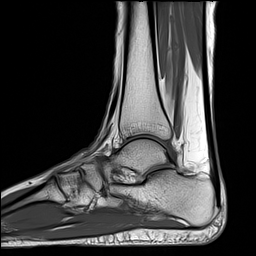
[im 16/21]
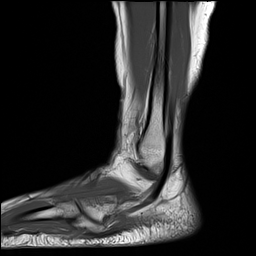
[im 21/21]
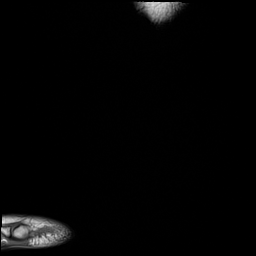

[Series 8: STIR · sagittal · right · 4.0mm · 0.35mm/px · 5 of 21 slices shown]
[im 1/21]
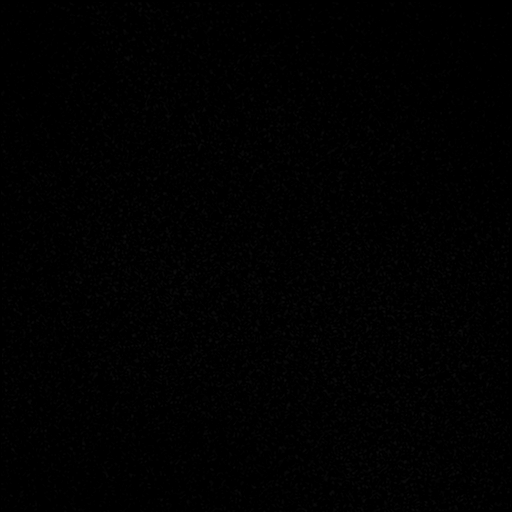
[im 6/21]
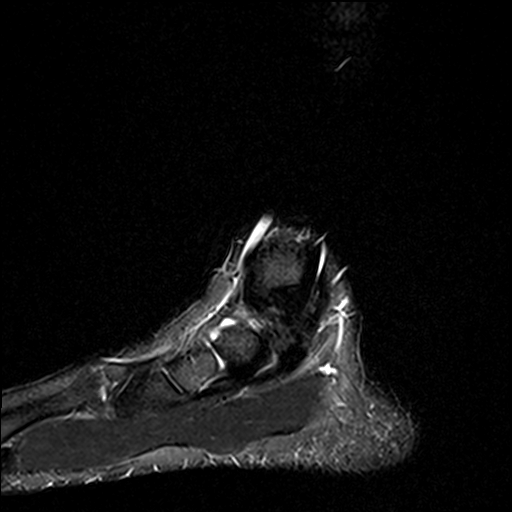
[im 11/21]
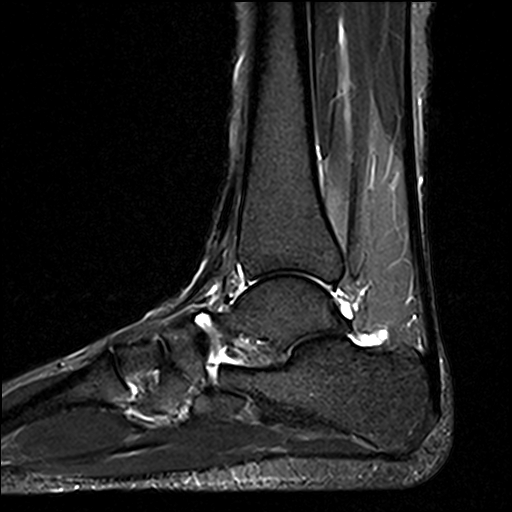
[im 16/21]
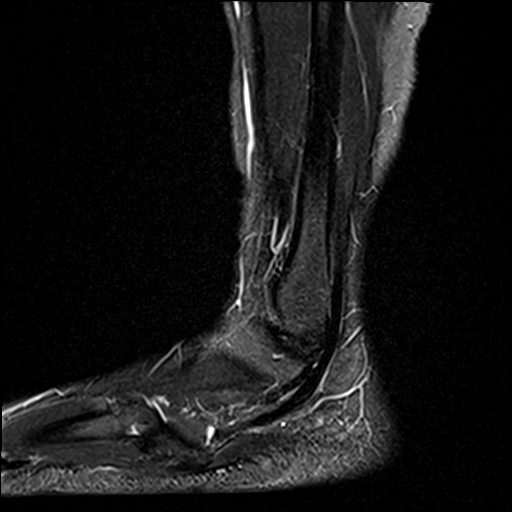
[im 21/21]
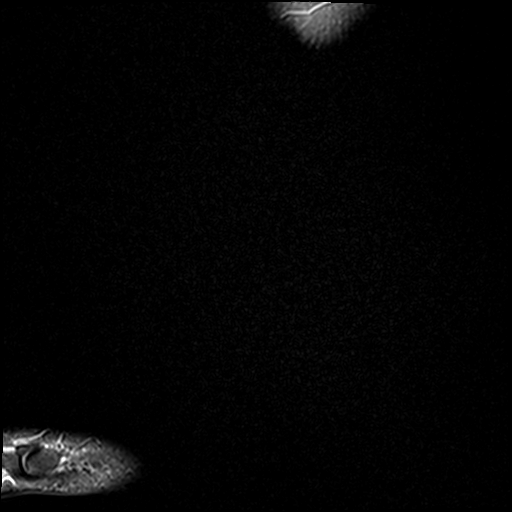

[40 of 40 positions shown; findings below may reference images not displayed]

FINDINGS: TENDONS

Peroneal: Normal.

Posteromedial: There is a tiny amount of fluid in the tendon sheath
of the posterior tibialis tendon at the level of the medial
malleolus. The posteromedial tendons are otherwise normal.

Anterior: Normal.

Achilles: Normal.

Plantar Fascia: Normal.

LIGAMENTS

Lateral: Slight chronic thickening of the anterior talofibular
ligament consistent with remote injury, unchanged since the prior
study. Posterior talofibular ligament and a calcaneofibular ligament
are intact.

Medial: Normal. The edema noted on the prior study has resolved.

CARTILAGE

Ankle Joint: No joint effusion or chondral defect.

Subtalar Joints/Sinus Tarsi: No joint effusion or chondral defect.

Bones: Minimal dorsal spurring at the talonavicular joint. Small
effusion at the talonavicular joint without chondral defect,
unchanged.

Other: None
IMPRESSION: 1. Minimal tenosynovitis of the posterior tibialis tendon at the
level of the medial malleolus.
2. Slight chronic thickening of the anterior talofibular ligament
consistent with remote injury.
3. No other significant abnormalities. The edema at the medial
aspect of the ankle joint noted on the prior study has resolved.

## 2022-03-20 ENCOUNTER — Telehealth: Payer: Self-pay
